# Patient Record
Sex: Male | Born: 1935 | Race: White | Hispanic: No | Marital: Married | Smoking: Current every day smoker
Health system: Southern US, Community
[De-identification: ages and names within clinical notes are randomized; demographics above are authoritative.]

## PROBLEM LIST (undated history)

## (undated) DIAGNOSIS — I249 Acute ischemic heart disease, unspecified: Secondary | ICD-10-CM

## (undated) DIAGNOSIS — I10 Essential (primary) hypertension: Secondary | ICD-10-CM

## (undated) DIAGNOSIS — E119 Type 2 diabetes mellitus without complications: Secondary | ICD-10-CM

## (undated) HISTORY — PX: APPENDECTOMY: SHX54

---

## 2006-10-22 ENCOUNTER — Other Ambulatory Visit: Payer: Self-pay

## 2006-10-22 ENCOUNTER — Inpatient Hospital Stay: Payer: Self-pay | Admitting: Internal Medicine

## 2007-03-09 ENCOUNTER — Ambulatory Visit: Payer: Self-pay | Admitting: Family Medicine

## 2008-01-10 ENCOUNTER — Ambulatory Visit: Payer: Self-pay | Admitting: Family Medicine

## 2009-08-15 HISTORY — PX: CORONARY ARTERY BYPASS GRAFT: SHX141

## 2009-11-07 ENCOUNTER — Inpatient Hospital Stay: Payer: Self-pay | Admitting: Internal Medicine

## 2009-12-30 ENCOUNTER — Emergency Department: Payer: Self-pay | Admitting: Unknown Physician Specialty

## 2011-03-29 ENCOUNTER — Ambulatory Visit: Payer: Self-pay | Admitting: Cardiovascular Disease

## 2012-11-21 ENCOUNTER — Ambulatory Visit: Payer: Self-pay | Admitting: Ophthalmology

## 2015-06-03 ENCOUNTER — Ambulatory Visit
Admission: EM | Admit: 2015-06-03 | Discharge: 2015-06-03 | Disposition: A | Discharge status: Home / Self Care | Payer: Medicare Other | Attending: Family Medicine | Admitting: Family Medicine

## 2015-06-03 ENCOUNTER — Ambulatory Visit: Payer: Medicare Other

## 2015-06-03 DIAGNOSIS — T148XXA Other injury of unspecified body region, initial encounter: Secondary | ICD-10-CM

## 2015-06-03 DIAGNOSIS — S42201A Unspecified fracture of upper end of right humerus, initial encounter for closed fracture: Secondary | ICD-10-CM | POA: Diagnosis not present

## 2015-06-03 HISTORY — DX: Type 2 diabetes mellitus without complications: E11.9

## 2015-06-03 HISTORY — DX: Essential (primary) hypertension: I10

## 2015-06-03 MED ORDER — TETANUS-DIPHTH-ACELL PERTUSSIS 5-2.5-18.5 LF-MCG/0.5 IM SUSP
0.5000 mL | Freq: Once | INTRAMUSCULAR | Status: AC
  Administered 2015-06-03: 0.5 mL via INTRAMUSCULAR

## 2015-06-03 MED ORDER — INFLUENZA VIRUS VACC SPLIT PF IM SUSP
0.5000 mL | Freq: Once | INTRAMUSCULAR | Status: AC
  Administered 2015-06-03: 0.5 mL via INTRAMUSCULAR

## 2015-06-03 MED ORDER — HYDROCODONE-ACETAMINOPHEN 5-325 MG PO TABS: 1.0000 | ORAL_TABLET | Freq: Three times a day (TID) | ORAL | Status: AC | PRN

## 2015-06-03 NOTE — ED Notes (Signed)
Pt states "I fell yesterday and injured my head and right upper arm." Denies LOC. Bruising noted to right forehead/temporal area. Difficulty raising right arm.

## 2015-06-03 NOTE — ED Provider Notes (Signed)
CSN: [id number]     Arrival date & time 06/03/15  1400 History   First MD Initiated Contact with Patient 06/03/15 1449     Chief Complaint  Patient presents with  . Fall  . Head Injury  . Shoulder Pain  . Arm Injury   (Consider location/radiation/quality/duration/timing/severity/associated sxs/prior Treatment) HPI Comments: 81 yo male with a h/o fall yesterday, hitting his right forehead area and right upper arm. Denies any loss of consciousness, vision changes, numbness/tingling. Sustained a skin abrasion on the right forehead and complains of pain to the right shoulder worse with movement. States he tripped over a gate on his porch. Denies any chest pains or shortness of breath.  The history is provided by the patient.    Past Medical History  Diagnosis Date  . Diabetes mellitus without complication (HCC)   . Hypertension    Past Surgical History  Procedure Laterality Date  . Appendectomy     History reviewed. No pertinent family history. Social History  Substance Use Topics  . Smoking status: Current Every Day Smoker -- 2.00 packs/day  . Smokeless tobacco: None  . Alcohol Use: No    Review of Systems  Allergies  Review of patient's allergies indicates no known allergies.  Home Medications   Prior to Admission medications   Medication Sig Start Date End Date Taking? Authorizing Provider  aspirin 81 MG chewable tablet Chew by mouth daily.   Yes Historical Provider, MD  enalapril (VASOTEC) 2.5 MG tablet Take 2.5 mg by mouth daily.   Yes Historical Provider, MD  metFORMIN (GLUCOPHAGE) 500 MG tablet Take by mouth 2 (two) times daily with a meal.   Yes Historical Provider, MD  metoprolol succinate (TOPROL-XL) 50 MG 24 hr tablet Take 50 mg by mouth daily. Take with or immediately following a meal.   Yes Historical Provider, MD  rosuvastatin (CRESTOR) 40 MG tablet Take 40 mg by mouth daily.   Yes Historical Provider, MD  HYDROcodone-acetaminophen (NORCO/VICODIN) 5-325 MG  tablet Take 1 tablet by mouth every 8 (eight) hours as needed for moderate pain or severe pain (do not drive or operate machinery while taking as can cause drowsiness). 06/03/15   Renford DillsLindsey Miller, NP   Meds Ordered and Administered this Visit   Medications  influenza  inactive virus vaccine (FLUZONE/FLUARIX) injection 0.5 mL (not administered)  Tdap (BOOSTRIX) injection 0.5 mL (0.5 mLs Intramuscular Given 06/03/15 1623)    BP 103/78 mmHg  Temp(Src) 98.5 F (36.9 C) (Tympanic)  Resp 16  Ht 6' (1.829 m)  Wt 136 lb (61.689 kg)  BMI 18.44 kg/m2  SpO2 94% No data found.   Physical Exam  Constitutional: He appears well-developed and well-nourished. No distress.  HENT:  Head: Normocephalic.  Right Ear: External ear normal.  Left Ear: External ear normal.  Nose: Nose normal.  approx 3cm superficial skin abrasion on the right forehead area  Eyes: EOM are normal. Pupils are equal, round, and reactive to light.  Neck: Neck supple.  Cardiovascular: Normal rate, regular rhythm and normal heart sounds.   Pulmonary/Chest: Effort normal and breath sounds normal. No respiratory distress. He has no wheezes. He has no rales.  Musculoskeletal:       Right shoulder: He exhibits decreased range of motion, tenderness, bony tenderness, swelling and decreased strength. He exhibits no effusion, no crepitus, no deformity, no laceration and normal pulse.  Right upper extremity neurovascularly intact; normal ROM of his hand/fingers, wrist and elbow; limited abduction of right shoulder secondary to pain  Lymphadenopathy:    He has no cervical adenopathy.  Skin: He is not diaphoretic.  Nursing note and vitals reviewed.   ED Course  Procedures (including critical care time)  Labs Review Labs Reviewed - No data to display  Imaging Review Dg Shoulder Right  06/03/2015  CLINICAL DATA:  Status post fall. EXAM: RIGHT SHOULDER - 2+ VIEW COMPARISON:  None. FINDINGS: There is a comminuted fracture of the  surgical neck of the right proximal humerus with minimal displacement. There is no glenohumeral dislocation. The acromioclavicular joint is congruent. IMPRESSION: Comminuted fracture of the surgical neck of the right proximal humerus with minimal displacement. Electronically Signed   By: Elige Ko   On: 06/03/2015 15:34   Dg Humerus Right  06/03/2015  CLINICAL DATA:  Status post fall.  Pain. EXAM: RIGHT HUMERUS - 2+ VIEW COMPARISON:  None. FINDINGS: Mildly comminuted fracture of the surgical neck of the right proximal humerus with minimal displacement. No glenohumeral dislocation. Congruent acromioclavicular joint. IMPRESSION: Mildly comminuted fracture of the surgical neck of the right proximal humerus. Electronically Signed   By: Elige Ko   On: 06/03/2015 15:36     Visual Acuity Review  Right Eye Distance:   Left Eye Distance:   Bilateral Distance:    Right Eye Near:   Left Eye Near:    Bilateral Near:         MDM   1. Proximal humerus fracture, right, closed, initial encounter   2. Abrasion of skin   (forehead)  1. Labs/x-ray results and diagnosis reviewed with patient/parent/guardian/family 2. rx as per orders above; reviewed possible side effects, interactions, risks and benefits  3. Right arm immobilized with sling 4. Forehead skin wound cleaned and dressed 5. Tetanus vaccine given 6. Follow up with orthopedist tomorrow (appointment set up; patient given info) 7. Follow-up prn if symptoms worsen or don't improve    Payton Mccallum, MD 06/03/15 1721

## 2015-06-03 NOTE — Discharge Instructions (Signed)
Humerus Fracture Treated With Immobilization The humerus is the large bone in your upper arm. You have a broken (fractured) humerus. These fractures are easily diagnosed with X-rays. TREATMENT  Simple fractures which will heal without disability are treated with simple immobilization. Immobilization means you will wear a cast, splint, or sling. You have a fracture which will do well with immobilization. The fracture will heal well simply by being held in a good position until it is stable enough to begin range of motion exercises. Do not take part in activities which would further injure your arm.  HOME CARE INSTRUCTIONS   Put ice on the injured area.  Put ice in a plastic bag.  Place a towel between your skin and the bag.  Leave the ice on for 15-20 minutes, 03-04 times a day.  If you have a cast:  Do not scratch the skin under the cast using sharp or pointed objects.  Check the skin around the cast every day. You may put lotion on any red or sore areas.  Keep your cast dry and clean.  If you have a splint:  Wear the splint as directed.  Keep your splint dry and clean.  You may loosen the elastic around the splint if your fingers become numb, tingle, or turn cold or blue.  If you have a sling:  Wear the sling as directed.  Do not put pressure on any part of your cast or splint until it is fully hardened.  Your cast or splint can be protected during bathing with a plastic bag. Do not lower the cast or splint into water.  Only take over-the-counter or prescription medicines for pain, discomfort, or fever as directed by your caregiver.  Do range of motion exercises as instructed by your caregiver.  Follow up as directed by your caregiver. This is very important in order to avoid permanent injury or disability and chronic pain. SEEK IMMEDIATE MEDICAL CARE IF:   Your skin or nails in the injured arm turn blue or gray.  Your arm feels cold or numb.  You develop severe pain  in the injured arm.  You are having problems with the medicines you were given. MAKE SURE YOU:   Understand these instructions.  Will watch your condition.  Will get help right away if you are not doing well or get worse.   This information is not intended to replace advice given to you by your health care provider. Make sure you discuss any questions you have with your health care provider.   Document Released: 11/07/2000 Document Revised: 08/22/2014 Document Reviewed: 12/24/2014 Elsevier Interactive Patient Education 2016 [employer]   Follow up tomorrow with Dr. Hyacinth MeekerMiller (Orthopedist); 06/04/15 at 1:20 [facility] [phone]

## 2016-07-12 ENCOUNTER — Emergency Department: Payer: Medicare Other

## 2016-07-12 ENCOUNTER — Encounter: Payer: Self-pay | Admitting: Emergency Medicine

## 2016-07-12 ENCOUNTER — Inpatient Hospital Stay
Admission: EM | Admit: 2016-07-12 | Discharge: 2016-07-15 | DRG: 470 | Disposition: A | Discharge status: Skilled Nursing Facility | Payer: Medicare Other | Attending: Internal Medicine | Admitting: Internal Medicine

## 2016-07-12 DIAGNOSIS — Z7982 Long term (current) use of aspirin: Secondary | ICD-10-CM | POA: Diagnosis not present

## 2016-07-12 DIAGNOSIS — Y92009 Unspecified place in unspecified non-institutional (private) residence as the place of occurrence of the external cause: Secondary | ICD-10-CM | POA: Diagnosis not present

## 2016-07-12 DIAGNOSIS — W19XXXA Unspecified fall, initial encounter: Secondary | ICD-10-CM

## 2016-07-12 DIAGNOSIS — I10 Essential (primary) hypertension: Secondary | ICD-10-CM | POA: Diagnosis present

## 2016-07-12 DIAGNOSIS — E785 Hyperlipidemia, unspecified: Secondary | ICD-10-CM | POA: Diagnosis present

## 2016-07-12 DIAGNOSIS — J449 Chronic obstructive pulmonary disease, unspecified: Secondary | ICD-10-CM | POA: Diagnosis present

## 2016-07-12 DIAGNOSIS — S72031A Displaced midcervical fracture of right femur, initial encounter for closed fracture: Secondary | ICD-10-CM | POA: Diagnosis present

## 2016-07-12 DIAGNOSIS — Z951 Presence of aortocoronary bypass graft: Secondary | ICD-10-CM | POA: Diagnosis not present

## 2016-07-12 DIAGNOSIS — W010XXA Fall on same level from slipping, tripping and stumbling without subsequent striking against object, initial encounter: Secondary | ICD-10-CM | POA: Diagnosis present

## 2016-07-12 DIAGNOSIS — R262 Difficulty in walking, not elsewhere classified: Secondary | ICD-10-CM

## 2016-07-12 DIAGNOSIS — R0902 Hypoxemia: Secondary | ICD-10-CM | POA: Diagnosis not present

## 2016-07-12 DIAGNOSIS — I2510 Atherosclerotic heart disease of native coronary artery without angina pectoris: Secondary | ICD-10-CM | POA: Diagnosis present

## 2016-07-12 DIAGNOSIS — Z8249 Family history of ischemic heart disease and other diseases of the circulatory system: Secondary | ICD-10-CM

## 2016-07-12 DIAGNOSIS — Z79899 Other long term (current) drug therapy: Secondary | ICD-10-CM

## 2016-07-12 DIAGNOSIS — M6281 Muscle weakness (generalized): Secondary | ICD-10-CM

## 2016-07-12 DIAGNOSIS — Z794 Long term (current) use of insulin: Secondary | ICD-10-CM | POA: Diagnosis not present

## 2016-07-12 DIAGNOSIS — S72009A Fracture of unspecified part of neck of unspecified femur, initial encounter for closed fracture: Secondary | ICD-10-CM

## 2016-07-12 DIAGNOSIS — E119 Type 2 diabetes mellitus without complications: Secondary | ICD-10-CM | POA: Diagnosis present

## 2016-07-12 DIAGNOSIS — M25551 Pain in right hip: Secondary | ICD-10-CM | POA: Diagnosis present

## 2016-07-12 DIAGNOSIS — S7291XA Unspecified fracture of right femur, initial encounter for closed fracture: Secondary | ICD-10-CM | POA: Diagnosis present

## 2016-07-12 DIAGNOSIS — Q899 Congenital malformation, unspecified: Secondary | ICD-10-CM

## 2016-07-12 DIAGNOSIS — F17210 Nicotine dependence, cigarettes, uncomplicated: Secondary | ICD-10-CM | POA: Diagnosis present

## 2016-07-12 HISTORY — DX: Acute ischemic heart disease, unspecified: I24.9

## 2016-07-12 LAB — CBC WITH DIFFERENTIAL/PLATELET
Basophils Absolute: 0.1 10*3/uL (ref 0–0.1)
Basophils Relative: 1 %
EOS ABS: 0.3 10*3/uL (ref 0–0.7)
EOS PCT: 3 %
HCT: 39.8 % — ABNORMAL LOW (ref 40.0–52.0)
HEMOGLOBIN: 13.4 g/dL (ref 13.0–18.0)
LYMPHS ABS: 1.8 10*3/uL (ref 1.0–3.6)
LYMPHS PCT: 20 %
MCH: 29.9 pg (ref 26.0–34.0)
MCHC: 33.8 g/dL (ref 32.0–36.0)
MCV: 88.5 fL (ref 80.0–100.0)
MONOS PCT: 7 %
Monocytes Absolute: 0.6 10*3/uL (ref 0.2–1.0)
NEUTROS PCT: 69 %
Neutro Abs: 6.3 10*3/uL (ref 1.4–6.5)
Platelets: 211 10*3/uL (ref 150–440)
RBC: 4.49 MIL/uL (ref 4.40–5.90)
RDW: 15.4 % — ABNORMAL HIGH (ref 11.5–14.5)
WBC: 9.1 10*3/uL (ref 3.8–10.6)

## 2016-07-12 LAB — COMPREHENSIVE METABOLIC PANEL
ALT: 17 U/L (ref 17–63)
AST: 32 U/L (ref 15–41)
Albumin: 3.9 g/dL (ref 3.5–5.0)
Alkaline Phosphatase: 72 U/L (ref 38–126)
Anion gap: 8 (ref 5–15)
BUN: 18 mg/dL (ref 6–20)
CHLORIDE: 96 mmol/L — AB (ref 101–111)
CO2: 28 mmol/L (ref 22–32)
CREATININE: 0.75 mg/dL (ref 0.61–1.24)
Calcium: 9 mg/dL (ref 8.9–10.3)
GFR calc Af Amer: >60 mL/min (ref >60)
GFR calc non Af Amer: >60 mL/min (ref >60)
Glucose, Bld: 153 mg/dL — ABNORMAL HIGH (ref 65–99)
POTASSIUM: 4.4 mmol/L (ref 3.5–5.1)
SODIUM: 132 mmol/L — AB (ref 135–145)
Total Bilirubin: 0.5 mg/dL (ref 0.3–1.2)
Total Protein: 7.4 g/dL (ref 6.5–8.1)

## 2016-07-12 LAB — URINALYSIS COMPLETE WITH MICROSCOPIC (ARMC ONLY)
BILIRUBIN URINE: NEGATIVE
Bacteria, UA: NONE SEEN
GLUCOSE, UA: 50 mg/dL — AB
HGB URINE DIPSTICK: NEGATIVE
KETONES UR: NEGATIVE mg/dL
Leukocytes, UA: NEGATIVE
NITRITE: NEGATIVE
Protein, ur: NEGATIVE mg/dL
SPECIFIC GRAVITY, URINE: 1.012 (ref 1.005–1.030)
Squamous Epithelial / LPF: NONE SEEN
pH: 6 (ref 5.0–8.0)

## 2016-07-12 LAB — PROTIME-INR
INR: 1
PROTHROMBIN TIME: 13.2 s (ref 11.4–15.2)

## 2016-07-12 LAB — GLUCOSE, CAPILLARY: Glucose-Capillary: 75 mg/dL (ref 65–99)

## 2016-07-12 LAB — ABO/RH: ABO/RH(D): A POS

## 2016-07-12 LAB — APTT: APTT: 31 s (ref 24–36)

## 2016-07-12 MED ORDER — POLYETHYLENE GLYCOL 3350 17 G PO PACK: 17.0000 g | PACK | Freq: Every day | ORAL | Status: DC | PRN

## 2016-07-12 MED ORDER — INSULIN GLARGINE 100 UNIT/ML ~~LOC~~ SOLN
32.0000 [IU] | Freq: Every day | SUBCUTANEOUS | Status: DC
  Filled 2016-07-12: qty 0.32

## 2016-07-12 MED ORDER — MORPHINE SULFATE (PF) 4 MG/ML IV SOLN
4.0000 mg | Freq: Once | INTRAVENOUS | Status: AC
  Administered 2016-07-12: 4 mg via INTRAVENOUS
  Filled 2016-07-12: qty 1

## 2016-07-12 MED ORDER — ONDANSETRON HCL 4 MG PO TABS: 4.0000 mg | ORAL_TABLET | Freq: Four times a day (QID) | ORAL | Status: DC | PRN

## 2016-07-12 MED ORDER — INSULIN ASPART 100 UNIT/ML ~~LOC~~ SOLN: 0.0000 [IU] | Freq: Every day | SUBCUTANEOUS | Status: DC

## 2016-07-12 MED ORDER — MORPHINE SULFATE (PF) 4 MG/ML IV SOLN
2.0000 mg | Freq: Once | INTRAVENOUS | Status: AC
  Administered 2016-07-12: 2 mg via INTRAVENOUS
  Filled 2016-07-12: qty 1

## 2016-07-12 MED ORDER — ONDANSETRON HCL 4 MG/2ML IJ SOLN: 4.0000 mg | Freq: Four times a day (QID) | INTRAMUSCULAR | Status: DC | PRN

## 2016-07-12 MED ORDER — METOPROLOL SUCCINATE ER 50 MG PO TB24
50.0000 mg | ORAL_TABLET | Freq: Every day | ORAL | Status: DC
  Administered 2016-07-14: 50 mg via ORAL
  Filled 2016-07-12 (×2): qty 1

## 2016-07-12 MED ORDER — MORPHINE SULFATE (PF) 4 MG/ML IV SOLN: 2.0000 mg | INTRAVENOUS | Status: DC | PRN

## 2016-07-12 MED ORDER — CEFAZOLIN SODIUM-DEXTROSE 2-4 GM/100ML-% IV SOLN
2.0000 g | INTRAVENOUS | Status: AC
  Administered 2016-07-13: 2 g via INTRAVENOUS
  Filled 2016-07-12: qty 100

## 2016-07-12 MED ORDER — METHOCARBAMOL 500 MG PO TABS
500.0000 mg | ORAL_TABLET | Freq: Four times a day (QID) | ORAL | Status: DC | PRN
  Administered 2016-07-12: 500 mg via ORAL
  Filled 2016-07-12: qty 1

## 2016-07-12 MED ORDER — ONDANSETRON HCL 4 MG/2ML IJ SOLN
4.0000 mg | INTRAMUSCULAR | Status: AC
  Administered 2016-07-12: 4 mg via INTRAVENOUS
  Filled 2016-07-12: qty 2

## 2016-07-12 MED ORDER — ACETAMINOPHEN 650 MG RE SUPP
650.0000 mg | Freq: Four times a day (QID) | RECTAL | Status: DC | PRN
Start: 2016-07-12 — End: 2016-07-13

## 2016-07-12 MED ORDER — ROSUVASTATIN CALCIUM 20 MG PO TABS
40.0000 mg | ORAL_TABLET | Freq: Every day | ORAL | Status: DC
  Administered 2016-07-14 – 2016-07-15 (×2): 40 mg via ORAL
  Filled 2016-07-12 (×2): qty 2

## 2016-07-12 MED ORDER — ASPIRIN 81 MG PO CHEW
81.0000 mg | CHEWABLE_TABLET | Freq: Every day | ORAL | Status: DC
  Administered 2016-07-14 – 2016-07-15 (×2): 81 mg via ORAL
  Filled 2016-07-12 (×2): qty 1

## 2016-07-12 MED ORDER — DEXTROSE-NACL 5-0.45 % IV SOLN
INTRAVENOUS | Status: DC
  Administered 2016-07-12: 1000 mL via INTRAVENOUS

## 2016-07-12 MED ORDER — INSULIN ASPART 100 UNIT/ML ~~LOC~~ SOLN
0.0000 [IU] | Freq: Three times a day (TID) | SUBCUTANEOUS | Status: DC
  Administered 2016-07-14 (×2): 2 [IU] via SUBCUTANEOUS
  Administered 2016-07-14: 5 [IU] via SUBCUTANEOUS
  Administered 2016-07-15: 3 [IU] via SUBCUTANEOUS
  Administered 2016-07-15: 1 [IU] via SUBCUTANEOUS
  Filled 2016-07-12: qty 5
  Filled 2016-07-12: qty 2
  Filled 2016-07-12: qty 3
  Filled 2016-07-12: qty 2
  Filled 2016-07-12: qty 1

## 2016-07-12 MED ORDER — OXYCODONE HCL 5 MG PO TABS: 5.0000 mg | ORAL_TABLET | ORAL | Status: DC | PRN

## 2016-07-12 MED ORDER — DOCUSATE SODIUM 100 MG PO CAPS
100.0000 mg | ORAL_CAPSULE | Freq: Two times a day (BID) | ORAL | Status: DC
  Administered 2016-07-12: 100 mg via ORAL
  Filled 2016-07-12: qty 1

## 2016-07-12 MED ORDER — METHOCARBAMOL 1000 MG/10ML IJ SOLN
500.0000 mg | Freq: Four times a day (QID) | INTRAVENOUS | Status: DC | PRN
  Filled 2016-07-12: qty 5

## 2016-07-12 MED ORDER — ACETAMINOPHEN 325 MG PO TABS
650.0000 mg | ORAL_TABLET | Freq: Four times a day (QID) | ORAL | Status: DC | PRN
Start: 2016-07-12 — End: 2016-07-13

## 2016-07-12 NOTE — Progress Notes (Signed)
   [facility] [place] at [facility]   Advance care planning  Hospital Day: 0 days Miguel Kennedy is a 82 y.o. male presenting with Leg Pain and Fall .   Advance care planning discussed with patient  with additional Family at bedside. All questions in regards to overall condition and expected prognosis answered. The decision was made to continue current code status  CODE STATUS: full Time spent: 18 minutes

## 2016-07-12 NOTE — ED Notes (Signed)
Patient transported to radiology

## 2016-07-12 NOTE — ED Notes (Signed)
Patient started on 3L oxygen, he was satting in mid 80's sitting still in bed.  O2 sensor changed and fingers changed with no change in previous results.

## 2016-07-12 NOTE — H&P (Signed)
[facility] - [place] at [facility]   PATIENT NAME: Miguel Kennedy    MR#:  [id number]  DATE OF BIRTH:  07-Oct-1930   DATE OF ADMISSION:  07/12/2016  PRIMARY CARE PHYSICIAN: Maryruth HancockSALLIE PATEL, MD   REQUESTING/REFERRING PHYSICIAN: York CeriseForbach  CHIEF COMPLAINT:   Chief Complaint  Patient presents with  . Leg Pain  . Fall    HISTORY OF PRESENT ILLNESS:  Miguel Kennedy  is a 82 y.o. male with a known history of Type 2 diabetes insulin requiring, coronary artery disease status post bypass who is presenting after mechanical fall. Patient suffered a mechanical fall while at home fell into the Christmas tree and noticed immediate pain of the right hip. Described only as "pain" intensity 8/10 nonradiating worse with movement no relieving factors. Current denies any symptoms.  PAST MEDICAL HISTORY:   Past Medical History:  Diagnosis Date  . ACS (acute coronary syndrome) (HCC)    s/p CABG in 2011  . Diabetes mellitus without complication (HCC)   . Hypertension     PAST SURGICAL HISTORY:   Past Surgical History:  Procedure Laterality Date  . APPENDECTOMY    . CORONARY ARTERY BYPASS GRAFT  2011    SOCIAL HISTORY:   Social History  Substance Use Topics  . Smoking status: Current Every Day Smoker    Packs/day: 2.00  . Smokeless tobacco: Never Used  . Alcohol use No    FAMILY HISTORY:   Family History  Problem Relation Age of Onset  . Hypertension Other     DRUG ALLERGIES:  No Known Allergies  REVIEW OF SYSTEMS:  REVIEW OF SYSTEMS:  CONSTITUTIONAL: Denies fevers, chills, fatigue, weakness.  EYES: Denies blurred vision, double vision, or eye pain.  EARS, NOSE, THROAT: Denies tinnitus, ear pain, hearing loss.  RESPIRATORY: denies cough, shortness of breath, wheezing  CARDIOVASCULAR: Denies chest pain, palpitations, edema.  GASTROINTESTINAL: Denies nausea, vomiting, diarrhea, abdominal pain.  GENITOURINARY: Denies dysuria, hematuria.  ENDOCRINE: Denies  nocturia or thyroid problems. HEMATOLOGIC AND LYMPHATIC: Denies easy bruising or bleeding.  SKIN: Denies rash or lesions.  MUSCULOSKELETAL: Denies pain in neck, back, shoulder, knees, , or further arthritic symptoms. Positive right-sided hip pain NEUROLOGIC: Denies paralysis, paresthesias.  PSYCHIATRIC: Denies anxiety or depressive symptoms. Otherwise full review of systems performed by me is negative.   MEDICATIONS AT HOME:   Prior to Admission medications   Medication Sig Start Date End Date Taking? Authorizing Provider  aspirin 81 MG chewable tablet Chew by mouth daily.    Historical Provider, MD  enalapril (VASOTEC) 2.5 MG tablet Take 2.5 mg by mouth daily.    Historical Provider, MD  HYDROcodone-acetaminophen (NORCO/VICODIN) 5-325 MG tablet Take 1 tablet by mouth every 8 (eight) hours as needed for moderate pain or severe pain (do not drive or operate machinery while taking as can cause drowsiness). 06/03/15   Renford DillsLindsey Miller, NP  metFORMIN (GLUCOPHAGE) 500 MG tablet Take by mouth 2 (two) times daily with a meal.    Historical Provider, MD  metoprolol succinate (TOPROL-XL) 50 MG 24 hr tablet Take 50 mg by mouth daily. Take with or immediately following a meal.    Historical Provider, MD  rosuvastatin (CRESTOR) 40 MG tablet Take 40 mg by mouth daily.    Historical Provider, MD      VITAL SIGNS:  Blood pressure (!) 178/85, pulse (!) 108, temperature 98.2 F (36.8 C), temperature source Oral, height 6' (1.829 m), weight 63.5 kg (140 lb), SpO2 96 %.  PHYSICAL EXAMINATION:  VITAL SIGNS: Vitals:   07/12/16 1830  BP: (!) 178/85  Pulse: (!) 108  Temp: 98.2 F (36.8 C)   GENERAL:82 y.o.male currently in no acute distress.  HEAD: Normocephalic, atraumatic.  EYES: Pupils equal, round, reactive to light. Extraocular muscles intact. No scleral icterus.  MOUTH: Moist mucosal membrane. Dentition Poor. No abscess noted.  EAR, NOSE, THROAT: Clear without exudates. No external lesions.    NECK: Supple. No thyromegaly. No nodules. No JVD.  PULMONARY: Clear to ascultation, without wheeze rails or rhonci. No use of accessory muscles, Good respiratory effort. good air entry bilaterally CHEST: Nontender to palpation.  CARDIOVASCULAR: S1 and S2. Regular rate and rhythm. No murmurs, rubs, or gallops. No edema. Pedal pulses 2+ bilaterally.  GASTROINTESTINAL: Soft, nontender, nondistended. No masses. Positive bowel sounds. No hepatosplenomegaly.  MUSCULOSKELETAL: No swelling, clubbing, or edema. Limited Range of motion right lower extremity NEUROLOGIC: Cranial nerves II through XII are intact. No gross focal neurological deficits. Sensation intact. Reflexes intact.  SKIN: No ulceration, lesions, rashes, or cyanosis. Skin warm and dry. Turgor intact.  PSYCHIATRIC: Mood, affect within normal limits. The patient is awake, alert and oriented x 3. Insight, judgment intact.    LABORATORY PANEL:   CBC  Recent Labs Lab 07/12/16 1833  WBC 9.1  HGB 13.4  HCT 39.8*  PLT 211   ------------------------------------------------------------------------------------------------------------------  Chemistries   Recent Labs Lab 07/12/16 1833  NA 132*  K 4.4  CL 96*  CO2 28  GLUCOSE 153*  BUN 18  CREATININE 0.75  CALCIUM 9.0  AST 32  ALT 17  ALKPHOS 72  BILITOT 0.5   ------------------------------------------------------------------------------------------------------------------  Cardiac Enzymes No results for input(s): TROPONINI in the last 168 hours. ------------------------------------------------------------------------------------------------------------------  RADIOLOGY:  Dg Chest Port 1 View  Result Date: 07/12/2016 CLINICAL DATA:  Preop right hip fracture. Hypertension and tobacco use. EXAM: PORTABLE CHEST 1 VIEW COMPARISON:  12/30/2009 FINDINGS: The lungs are hyperinflated without pneumonic consolidation. Bilateral chronic appearing rib fractures are noted involving  the left fourth and fifth ribs and right fifth, sixth and seventh ribs. Patient is status post CABG. There is aortic atherosclerosis. No effusion or pneumothorax. Pulmonary vasculature is unremarkable. IMPRESSION: No acute pneumonic consolidation. Hyperinflated lungs consistent with COPD. Electronically Signed   By: Tollie Ethavid  Kwon M.D.   On: 07/12/2016 19:19   Dg Hip Unilat With Pelvis 2-3 Views Right  Result Date: 07/12/2016 CLINICAL DATA:  Patient fell complaining of right lower extremity pain. EXAM: DG HIP (WITH OR WITHOUT PELVIS) 2-3V RIGHT COMPARISON:  10/25/2006 KUB FINDINGS: There is an acute, closed, varus angulated transcervical fracture of the right femur sparing the trochanters. The bony pelvis and contralateral hip are intact. There is aortoiliac and branch vessel atherosclerosis. IMPRESSION: Acute, closed, varus angulated transcervical fracture of the proximal right femur. Electronically Signed   By: Tollie Ethavid  Kwon M.D.   On: 07/12/2016 19:15    EKG:   Orders placed or performed during the hospital encounter of 07/12/16  . ED EKG  . ED EKG    IMPRESSION AND PLAN:   82 year old Caucasian gentleman history of type 2 diabetes insulin requiring presenting after mechanical fall  1. Preoperative evaluation for right-sided hip fracture: Orthopedic consult, patient should be considered moderate risk for moderate risk surgery from cardiac standpoint no further testing or interventions required prior to surgery. As far as medications will hold lisinopril in the preoperative period can be restarted postoperatively to avoid intraoperative hypotension  2. Type 2 diabetes insulin requiring: Hold oral agents add sliding scale  coverage, patient already has taking Lantus 32 units before coming to the hospital would have ideally decrease this by half as he'll be nothing by mouth midnight but instead will add D5 half normal saline at rate, can restart Lantus at normal dosing tomorrow  3. Essential  hypertension continue beta blocker hold lisinopril as above 4. Tobacco abuse: Patient smokes about a pack a day no desire to quit at this time we discussed various options, health risks and offered nicotine replacement in the hospital he states he has no arcus at this time. Time spent 4 minutes    All the records are reviewed and case discussed with ED provider. Management plans discussed with the patient, family and they are in agreement.  CODE STATUS: Full  TOTAL TIME TAKING CARE OF THIS PATIENT: 45 minutes.    Hower,  Mardi Mainland.D on 07/12/2016 at 8:01 PM  Between 7am to 6pm - Pager - [phone]  After 6pm: House Pager: - [phone]  [facility] [place] [facility]  Office  [phone]  CC: Primary care physician; Maryruth Hancock, MD

## 2016-07-12 NOTE — Consult Note (Signed)
ORTHOPAEDIC CONSULTATION  REQUESTING PHYSICIAN: Miguel Hasteavid K Hower, MD  Chief Complaint: Right hip pain status post fall  HPI: Miguel Kennedy is a 82 y.o. male who complains of pain in the right hip and an inability bear weight after a fall at home earlier today.  X-rays of the right hip were taken which are macerated displaced right femoral neck hip fracture.  Patient had another recent fall and sustained a fracture to the right proximal humerus involving the greater tuberosity.  Past Medical History:  Diagnosis Date  . ACS (acute coronary syndrome) (HCC)    s/p CABG in 2011  . Diabetes mellitus without complication (HCC)   . Hypertension    Past Surgical History:  Procedure Laterality Date  . APPENDECTOMY    . CORONARY ARTERY BYPASS GRAFT  2011   Social History   Social History  . Marital status: Married    Spouse name: N/A  . Number of children: N/A  . Years of education: N/A   Social History Main Topics  . Smoking status: Current Every Day Smoker    Packs/day: 2.00  . Smokeless tobacco: Never Used  . Alcohol use No  . Drug use: No  . Sexual activity: Not Asked   Other Topics Concern  . None   Social History Narrative  . None   Family History  Problem Relation Age of Onset  . Hypertension Other    No Known Allergies Prior to Admission medications   Medication Sig Start Date End Date Taking? Authorizing Provider  aspirin 81 MG chewable tablet Chew by mouth daily.   Yes Historical Provider, MD  enalapril (VASOTEC) 2.5 MG tablet Take 2.5 mg by mouth daily.   Yes Historical Provider, MD  gabapentin (NEURONTIN) 100 MG capsule Take 1 capsule by mouth 2 (two) times daily. 06/14/16  Yes Historical Provider, MD  insulin glargine (LANTUS) 100 UNIT/ML injection Inject 32 Units into the skin at bedtime.   Yes Historical Provider, MD  metFORMIN (GLUCOPHAGE) 500 MG tablet Take by mouth 2 (two) times daily with a meal.   Yes Historical Provider, MD  rosuvastatin (CRESTOR)  40 MG tablet Take 40 mg by mouth daily.   Yes Historical Provider, MD  HYDROcodone-acetaminophen (NORCO/VICODIN) 5-325 MG tablet Take 1 tablet by mouth every 8 (eight) hours as needed for moderate pain or severe pain (do not drive or operate machinery while taking as can cause drowsiness). 06/03/15   Miguel DillsLindsey Miller, NP  metoprolol succinate (TOPROL-XL) 50 MG 24 hr tablet Take 50 mg by mouth daily. Take with or immediately following a meal.    Historical Provider, MD   Dg Chest Port 1 View  Result Date: 07/12/2016 CLINICAL DATA:  Preop right hip fracture. Hypertension and tobacco use. EXAM: PORTABLE CHEST 1 VIEW COMPARISON:  12/30/2009 FINDINGS: The lungs are hyperinflated without pneumonic consolidation. Bilateral chronic appearing rib fractures are noted involving the left fourth and fifth ribs and right fifth, sixth and seventh ribs. Patient is status post CABG. There is aortic atherosclerosis. No effusion or pneumothorax. Pulmonary vasculature is unremarkable. IMPRESSION: No acute pneumonic consolidation. Hyperinflated lungs consistent with COPD. Electronically Signed   By: Miguel Kennedy M.D.   On: 07/12/2016 19:19   Dg Hip Unilat With Pelvis 2-3 Views Right  Result Date: 07/12/2016 CLINICAL DATA:  Patient fell complaining of right lower extremity pain. EXAM: DG HIP (WITH OR WITHOUT PELVIS) 2-3V RIGHT COMPARISON:  10/25/2006 KUB FINDINGS: There is an acute, closed, varus angulated transcervical fracture of the right femur  sparing the trochanters. The bony pelvis and contralateral hip are intact. There is aortoiliac and branch vessel atherosclerosis. IMPRESSION: Acute, closed, varus angulated transcervical fracture of the proximal right femur. Electronically Signed   By: Miguel Kennedy M.D.   On: 07/12/2016 19:15    Positive ROS: All other systems have been reviewed and were otherwise negative with the exception of those mentioned in the HPI and as above.  Physical Exam: General: Alert, no acute  distress  MUSCULOSKELETAL: Right lower extremity: Patient has shortening and external rotation of the right lower extremity. He has intact skin overlying the right hip without erythema or ecchymosis. He has no significant swelling in the right thigh compartments are soft and compressible. He has palpable pedal pulses, intact sensation light touch and intact motor function distally.  Assessment: Displaced right femoral neck hip fracture.  Plan: Patient is seen in the ER. His family is at the bedside. I explained to the patient and his family the nature of the fracture. I am recommending a right hip hemiarthroplasty for definitive treatment. I discussed this operation in detail with the family is along with the postoperative course. We discussed the risks and benefits of surgery as well. They understand the risks include but are not limited to infection requiring the removal of the prosthesis, bleeding requiring blood transfusion, nerve or blood vessel injury especially injury to the sciatic nerve leading to foot drop which may require permanent use of an AFO, leg length discrepancy, change in lower extremity rotation, persistent right hip pain dislocation, fracture and the need for further surgery, including conversion to a total hip arthroplasty. Medical risks include but are not limited to DVT and pulmonary embolism,: Collection, stroke, pneumonia, respiratory failure and death. Patient family understood these risks and wished to proceed. Patient is being admitted by Dr. Clint Kennedy from the hospitalist service. Dr. Clint Kennedy has cleared the patient for surgery but has stated the patient is a moderate risk for surgery. I reviewed the laboratory studies and radiographs in preparation for this case. I answered all questions by the patient and his family. Hopeful surgery will occur tomorrow but given the heavy OR schedule it is possible surgery may be delayed until Thursday. The family understood the  situation.    Miguel FairlyKRASINSKI, Falynn Ailey, MD    07/12/2016 8:17 PM

## 2016-07-12 NOTE — ED Provider Notes (Signed)
[facility] Emergency Department Provider Note  ____________________________________________   First MD Initiated Contact with Patient 07/12/16 1830     (approximate)  I have reviewed the triage vital signs and the nursing notes.   HISTORY  Chief Complaint Leg Pain and Fall    HPI Miguel Kennedy is a 82 y.o. male with a history of coronary artery disease status post CABG in 2011 who presents for evaluation of acute onset right hip pain after mechanical fall.  He states that he was at home and got up and try to turn around too quickly, lost his balance, and fell down on the hardwood floor.  Ear pain with any attempt at movement of the right leg but as long as he is at rest he is not in pain.  His leg is significantly externally rotated and shortened.  He is able to wiggle his toes without difficulty and he denies any numbness, tingling, or weakness in his extremity, he just does not want to move it due to the pain.  He did not strike his head and he denies headache and neck pain.  He did not lose consciousness.  He denies chest pain, shortness of breath, abdominal pain, nausea, vomiting.  He has not been ill recently.   Past Medical History:  Diagnosis Date  . ACS (acute coronary syndrome) (HCC)    s/p CABG in 2011  . Diabetes mellitus without complication (HCC)   . Hypertension     Patient Active Problem List   Diagnosis Date Noted  . Femur fracture, right (HCC) 07/12/2016    Past Surgical History:  Procedure Laterality Date  . APPENDECTOMY    . CORONARY ARTERY BYPASS GRAFT  2011    Prior to Admission medications   Medication Sig Start Date End Date Taking? Authorizing Provider  aspirin 81 MG chewable tablet Chew by mouth daily.   Yes Historical Provider, MD  enalapril (VASOTEC) 2.5 MG tablet Take 2.5 mg by mouth daily.   Yes Historical Provider, MD  gabapentin (NEURONTIN) 100 MG capsule Take 1 capsule by mouth 2 (two) times daily. 06/14/16   Yes Historical Provider, MD  HYDROcodone-acetaminophen (NORCO/VICODIN) 5-325 MG tablet Take 1 tablet by mouth every 8 (eight) hours as needed for moderate pain or severe pain (do not drive or operate machinery while taking as can cause drowsiness). 06/03/15  Yes Renford DillsLindsey Miller, NP  insulin glargine (LANTUS) 100 UNIT/ML injection Inject 32 Units into the skin at bedtime.   Yes Historical Provider, MD  metFORMIN (GLUCOPHAGE) 500 MG tablet Take by mouth 2 (two) times daily with a meal.   Yes Historical Provider, MD  metoprolol succinate (TOPROL-XL) 50 MG 24 hr tablet Take 50 mg by mouth daily. Take with or immediately following a meal.   Yes Historical Provider, MD  rosuvastatin (CRESTOR) 40 MG tablet Take 40 mg by mouth daily.   Yes Historical Provider, MD    Allergies Patient has no known allergies.  Family History  Problem Relation Age of Onset  . Hypertension Other     Social History Social History  Substance Use Topics  . Smoking status: Current Every Day Smoker    Packs/day: 2.00  . Smokeless tobacco: Never Used  . Alcohol use No    Review of Systems Constitutional: No fever/chills Eyes: No visual changes. ENT: No sore throat. Cardiovascular: Denies chest pain. Respiratory: Denies shortness of breath. Gastrointestinal: No abdominal pain.  No nausea, no vomiting.  No diarrhea.  No constipation. Genitourinary: Negative  for dysuria. Musculoskeletal: Negative for back and neck pain.  +right hip pain Skin: Negative for rash. Neurological: Negative for headaches, focal weakness or numbness.  10-point ROS otherwise negative.  ____________________________________________   PHYSICAL EXAM:  VITAL SIGNS: ED Triage Vitals [07/12/16 1830]  Enc Vitals Group     BP (!) 178/85     Pulse Rate (!) 108     Resp      Temp 98.2 F (36.8 C)     Temp Source Oral     SpO2 96 %     Weight 140 lb (63.5 kg)     Height 6' (1.829 m)     Head Circumference      Peak Flow      Pain  Score      Pain Loc      Pain Edu?      Excl. in GC?     Constitutional: Alert and oriented. Well appearing and in no acute distress. Eyes: Conjunctivae are normal. PERRL. EOMI. Head: Atraumatic. Nose: No congestion/rhinnorhea. Mouth/Throat: Mucous membranes are moist.  Oropharynx non-erythematous. Neck: No stridor.  No meningeal signs.  No cervical spine tenderness to palpation. Cardiovascular: Normal rate, regular rhythm. Good peripheral circulation. Grossly normal heart sounds. Respiratory: Normal respiratory effort.  No retractions.  Mild external wheezing but no respiratory distress. Gastrointestinal: Soft and nontender. No distention.  Musculoskeletal: External rotation and shortening of the right leg.  Tender palpation in the proximal femur and right hip.  No other obvious deformities.  Neurovascularly intact distally. Neurologic:  Normal speech and language. No gross focal neurologic deficits are appreciated other than the patient being hard of hearing Skin:  Skin is warm, dry and intact. No rash noted. Psychiatric: Mood and affect are normal. Speech and behavior are normal.  ____________________________________________   LABS (all labs ordered are listed, but only abnormal results are displayed)  Labs Reviewed  COMPREHENSIVE METABOLIC PANEL - Abnormal; Notable for the following:       Result Value   Sodium 132 (*)    Chloride 96 (*)    Glucose, Bld 153 (*)    All other components within normal limits  CBC WITH DIFFERENTIAL/PLATELET - Abnormal; Notable for the following:    HCT 39.8 (*)    RDW 15.4 (*)    All other components within normal limits  URINE CULTURE  APTT  PROTIME-INR  URINALYSIS COMPLETEWITH MICROSCOPIC ([facility] ONLY)  BASIC METABOLIC PANEL  CBC  TYPE AND SCREEN   ____________________________________________  EKG  ED ECG REPORT I, Tylynn Braniff, the attending physician, personally viewed and interpreted this ECG.  Date: 07/12/2016 EKG Time:  18:41 Rate: 108 Rhythm: Sinus tachycardia QRS Axis: normal Intervals: normal ST/T Wave abnormalities: normal Conduction Disturbances: none Narrative Interpretation: Interpretation is difficult due to the significant artifact that is a result of the  Patient's acute pain, but although there are some nonspecific changes I do not believe he has any changes to indicate acute ischemia. ____________________________________________  RADIOLOGY   Dg Chest Port 1 View  Result Date: 07/12/2016 CLINICAL DATA:  Preop right hip fracture. Hypertension and tobacco use. EXAM: PORTABLE CHEST 1 VIEW COMPARISON:  12/30/2009 FINDINGS: The lungs are hyperinflated without pneumonic consolidation. Bilateral chronic appearing rib fractures are noted involving the left fourth and fifth ribs and right fifth, sixth and seventh ribs. Patient is status post CABG. There is aortic atherosclerosis. No effusion or pneumothorax. Pulmonary vasculature is unremarkable. IMPRESSION: No acute pneumonic consolidation. Hyperinflated lungs consistent with COPD. Electronically Signed  By: Tollie Eth M.D.   On: 07/12/2016 19:19   Dg Hip Unilat With Pelvis 2-3 Views Right  Result Date: 07/12/2016 CLINICAL DATA:  Patient fell complaining of right lower extremity pain. EXAM: DG HIP (WITH OR WITHOUT PELVIS) 2-3V RIGHT COMPARISON:  10/25/2006 KUB FINDINGS: There is an acute, closed, varus angulated transcervical fracture of the right femur sparing the trochanters. The bony pelvis and contralateral hip are intact. There is aortoiliac and branch vessel atherosclerosis. IMPRESSION: Acute, closed, varus angulated transcervical fracture of the proximal right femur. Electronically Signed   By: Tollie Eth M.D.   On: 07/12/2016 19:15    ____________________________________________   PROCEDURES  Procedure(s) performed:   Procedures   Critical Care performed: No ____________________________________________   INITIAL IMPRESSION /  ASSESSMENT AND PLAN / ED COURSE  Pertinent labs & imaging results that were available during my care of the patient were reviewed by me and considered in my medical decision making (see chart for details).  Strong suspicion for right hip fracture due to mechanical fall.  I will obtain the standard preop evaluation labs and imaging.  No evidence of any other acute or emergent medical condition at this time.   Clinical Course     ____________________________________________  FINAL CLINICAL IMPRESSION(S) / ED DIAGNOSES  Final diagnoses:  Closed transcervical fracture of right femur, initial encounter (HCC)     MEDICATIONS GIVEN DURING THIS VISIT:  Medications  morphine 4 MG/ML injection 4 mg (not administered)  acetaminophen (TYLENOL) tablet 650 mg (not administered)    Or  acetaminophen (TYLENOL) suppository 650 mg (not administered)  oxyCODONE (Oxy IR/ROXICODONE) immediate release tablet 5 mg (not administered)  ondansetron (ZOFRAN) tablet 4 mg (not administered)    Or  ondansetron (ZOFRAN) injection 4 mg (not administered)  docusate sodium (COLACE) capsule 100 mg (not administered)  polyethylene glycol (MIRALAX / GLYCOLAX) packet 17 g (not administered)  morphine 4 MG/ML injection 2 mg (not administered)  methocarbamol (ROBAXIN) tablet 500 mg (not administered)    Or  methocarbamol (ROBAXIN) 500 mg in dextrose 5 % 50 mL IVPB (not administered)  insulin glargine (LANTUS) injection 32 Units (not administered)  dextrose 5 %-0.45 % sodium chloride infusion (not administered)  morphine 4 MG/ML injection 2 mg (2 mg Intravenous Given 07/12/16 1922)  ondansetron (ZOFRAN) injection 4 mg (4 mg Intravenous Given 07/12/16 1923)     NEW OUTPATIENT MEDICATIONS STARTED DURING THIS VISIT:  New Prescriptions   No medications on file    Modified Medications   No medications on file    Discontinued Medications   No medications on file     Note:  This document was prepared  using Dragon voice recognition software and may include unintentional dictation errors.    Loleta Rose, MD 07/12/16 2111

## 2016-07-12 NOTE — ED Triage Notes (Signed)
Per [facility], patient comes from home. Patient states his leg gave way and he fell into the christmas tree. Patient denies LOC or hitting head. Patient denies taking blood thinners. Patient is neurologically at baseline. Patient c/o right leg pain. External fixation and shortening noted to right leg. Positive pulses and sensations bilaterally.

## 2016-07-13 ENCOUNTER — Encounter
Admission: EM | Disposition: A | Discharge status: Skilled Nursing Facility | Payer: Self-pay | Source: Home / Self Care | Attending: Internal Medicine

## 2016-07-13 ENCOUNTER — Inpatient Hospital Stay: Payer: Medicare Other

## 2016-07-13 ENCOUNTER — Inpatient Hospital Stay: Payer: Medicare Other | Admitting: Anesthesiology

## 2016-07-13 HISTORY — PX: HIP ARTHROPLASTY: SHX981

## 2016-07-13 LAB — CBC
HEMATOCRIT: 38.1 % — AB (ref 40.0–52.0)
Hemoglobin: 12.9 g/dL — ABNORMAL LOW (ref 13.0–18.0)
MCH: 29.3 pg (ref 26.0–34.0)
MCHC: 33.9 g/dL (ref 32.0–36.0)
MCV: 86.3 fL (ref 80.0–100.0)
PLATELETS: 183 10*3/uL (ref 150–440)
RBC: 4.41 MIL/uL (ref 4.40–5.90)
RDW: 14.9 % — AB (ref 11.5–14.5)
WBC: 9.6 10*3/uL (ref 3.8–10.6)

## 2016-07-13 LAB — BASIC METABOLIC PANEL
Anion gap: 6 (ref 5–15)
BUN: 14 mg/dL (ref 6–20)
CHLORIDE: 100 mmol/L — AB (ref 101–111)
CO2: 28 mmol/L (ref 22–32)
CREATININE: 0.72 mg/dL (ref 0.61–1.24)
Calcium: 8.7 mg/dL — ABNORMAL LOW (ref 8.9–10.3)
GFR calc Af Amer: >60 mL/min (ref >60)
GFR calc non Af Amer: >60 mL/min (ref >60)
GLUCOSE: 66 mg/dL (ref 65–99)
Potassium: 3.9 mmol/L (ref 3.5–5.1)
Sodium: 134 mmol/L — ABNORMAL LOW (ref 135–145)

## 2016-07-13 LAB — SURGICAL PCR SCREEN
MRSA, PCR: NEGATIVE
Staphylococcus aureus: NEGATIVE

## 2016-07-13 LAB — GLUCOSE, CAPILLARY
Glucose-Capillary: 85 mg/dL (ref 65–99)
Glucose-Capillary: 123 mg/dL — ABNORMAL HIGH (ref 65–99)
Glucose-Capillary: 96 mg/dL (ref 65–99)
Glucose-Capillary: 355 mg/dL — ABNORMAL HIGH (ref 65–99)

## 2016-07-13 LAB — POCT CBG (FASTING - GLUCOSE)-MANUAL ENTRY: GLUCOSE FASTING, POC: 123 mg/dL — AB (ref 70–99)

## 2016-07-13 SURGERY — HEMIARTHROPLASTY, HIP, DIRECT ANTERIOR APPROACH, FOR FRACTURE
Anesthesia: General | Site: Hip | Laterality: Right | Wound class: Clean

## 2016-07-13 MED ORDER — MENTHOL 3 MG MT LOZG
1.0000 | LOZENGE | OROMUCOSAL | Status: DC | PRN
  Filled 2016-07-13: qty 9

## 2016-07-13 MED ORDER — EPHEDRINE SULFATE 50 MG/ML IJ SOLN
INTRAMUSCULAR | Status: DC | PRN
  Administered 2016-07-13 (×4): 5 mg via INTRAVENOUS
  Administered 2016-07-13 (×2): 10 mg via INTRAVENOUS
  Administered 2016-07-13: 5 mg via INTRAVENOUS

## 2016-07-13 MED ORDER — MORPHINE SULFATE (PF) 2 MG/ML IV SOLN: 2.0000 mg | INTRAVENOUS | Status: DC | PRN

## 2016-07-13 MED ORDER — SODIUM CHLORIDE 0.9 % IV SOLN
75.0000 mL/h | INTRAVENOUS | Status: DC
  Administered 2016-07-13 – 2016-07-14 (×2): 75 mL/h via INTRAVENOUS

## 2016-07-13 MED ORDER — HYDROCODONE-ACETAMINOPHEN 5-325 MG PO TABS
1.0000 | ORAL_TABLET | Freq: Four times a day (QID) | ORAL | Status: DC | PRN
  Administered 2016-07-14 (×2): 1 via ORAL
  Filled 2016-07-13 (×2): qty 1

## 2016-07-13 MED ORDER — PHENOL 1.4 % MT LIQD
1.0000 | OROMUCOSAL | Status: DC | PRN
  Filled 2016-07-13: qty 177

## 2016-07-13 MED ORDER — FUROSEMIDE 10 MG/ML IJ SOLN
INTRAMUSCULAR | Status: DC | PRN
Start: 2016-07-13 — End: 2016-07-13
  Administered 2016-07-13: 20 mg via INTRAMUSCULAR

## 2016-07-13 MED ORDER — MIDAZOLAM HCL 2 MG/2ML IJ SOLN
INTRAMUSCULAR | Status: DC | PRN
  Administered 2016-07-13: 2 mg via INTRAVENOUS

## 2016-07-13 MED ORDER — METOPROLOL TARTRATE 5 MG/5ML IV SOLN
INTRAVENOUS | Status: DC | PRN
  Administered 2016-07-13: 2 mg via INTRAVENOUS
  Administered 2016-07-13 (×2): 1 mg via INTRAVENOUS

## 2016-07-13 MED ORDER — METHOCARBAMOL 1000 MG/10ML IJ SOLN
500.0000 mg | Freq: Four times a day (QID) | INTRAVENOUS | Status: DC | PRN
  Filled 2016-07-13: qty 5

## 2016-07-13 MED ORDER — ACETAMINOPHEN 325 MG PO TABS: 650.0000 mg | ORAL_TABLET | Freq: Four times a day (QID) | ORAL | Status: DC | PRN

## 2016-07-13 MED ORDER — FUROSEMIDE 10 MG/ML IJ SOLN
INTRAMUSCULAR | Status: AC
  Filled 2016-07-13: qty 2

## 2016-07-13 MED ORDER — NEOMYCIN-POLYMYXIN B GU 40-200000 IR SOLN
Status: DC | PRN
  Administered 2016-07-13: 4 mL

## 2016-07-13 MED ORDER — ONDANSETRON HCL 4 MG/2ML IJ SOLN: 4.0000 mg | Freq: Four times a day (QID) | INTRAMUSCULAR | Status: DC | PRN

## 2016-07-13 MED ORDER — DOCUSATE SODIUM 100 MG PO CAPS
100.0000 mg | ORAL_CAPSULE | Freq: Two times a day (BID) | ORAL | Status: DC
  Administered 2016-07-13 – 2016-07-15 (×4): 100 mg via ORAL
  Filled 2016-07-13 (×4): qty 1

## 2016-07-13 MED ORDER — SODIUM CHLORIDE 0.9 % IV SOLN
INTRAVENOUS | Status: DC
  Administered 2016-07-13 (×2): via INTRAVENOUS

## 2016-07-13 MED ORDER — METHOCARBAMOL 500 MG PO TABS: 500.0000 mg | ORAL_TABLET | Freq: Four times a day (QID) | ORAL | Status: DC | PRN

## 2016-07-13 MED ORDER — INSULIN GLARGINE 100 UNIT/ML ~~LOC~~ SOLN
26.0000 [IU] | Freq: Every day | SUBCUTANEOUS | Status: DC
  Administered 2016-07-13 – 2016-07-14 (×2): 26 [IU] via SUBCUTANEOUS
  Filled 2016-07-13 (×3): qty 0.26

## 2016-07-13 MED ORDER — BISACODYL 10 MG RE SUPP: 10.0000 mg | Freq: Every day | RECTAL | Status: DC | PRN

## 2016-07-13 MED ORDER — CEFAZOLIN SODIUM-DEXTROSE 2-4 GM/100ML-% IV SOLN
INTRAVENOUS | Status: AC
  Filled 2016-07-13: qty 100

## 2016-07-13 MED ORDER — FENTANYL CITRATE (PF) 100 MCG/2ML IJ SOLN
INTRAMUSCULAR | Status: DC | PRN
  Administered 2016-07-13: 25 ug via INTRAVENOUS
  Administered 2016-07-13: 50 ug via INTRAVENOUS
  Administered 2016-07-13: 25 ug via INTRAVENOUS

## 2016-07-13 MED ORDER — ACETAMINOPHEN 650 MG RE SUPP: 650.0000 mg | Freq: Four times a day (QID) | RECTAL | Status: DC | PRN

## 2016-07-13 MED ORDER — ALUM & MAG HYDROXIDE-SIMETH 200-200-20 MG/5ML PO SUSP: 30.0000 mL | ORAL | Status: DC | PRN

## 2016-07-13 MED ORDER — CEFAZOLIN SODIUM-DEXTROSE 2-4 GM/100ML-% IV SOLN
2.0000 g | Freq: Four times a day (QID) | INTRAVENOUS | Status: AC
  Administered 2016-07-13 – 2016-07-14 (×2): 2 g via INTRAVENOUS
  Filled 2016-07-13 (×2): qty 100

## 2016-07-13 MED ORDER — MAGNESIUM CITRATE PO SOLN
1.0000 | Freq: Once | ORAL | Status: DC | PRN
  Filled 2016-07-13: qty 296

## 2016-07-13 MED ORDER — PROPOFOL 10 MG/ML IV BOLUS
INTRAVENOUS | Status: DC | PRN
  Administered 2016-07-13: 20 mg via INTRAVENOUS
  Administered 2016-07-13: 80 mg via INTRAVENOUS
  Administered 2016-07-13: 20 mg via INTRAVENOUS

## 2016-07-13 MED ORDER — NEOMYCIN-POLYMYXIN B GU 40-200000 IR SOLN
Status: AC
  Filled 2016-07-13: qty 20

## 2016-07-13 MED ORDER — ROCURONIUM BROMIDE 100 MG/10ML IV SOLN
INTRAVENOUS | Status: DC | PRN
  Administered 2016-07-13: 30 mg via INTRAVENOUS
  Administered 2016-07-13: 10 mg via INTRAVENOUS

## 2016-07-13 MED ORDER — SENNA 8.6 MG PO TABS
1.0000 | ORAL_TABLET | Freq: Two times a day (BID) | ORAL | Status: DC
  Administered 2016-07-13 – 2016-07-15 (×4): 8.6 mg via ORAL
  Filled 2016-07-13 (×4): qty 1

## 2016-07-13 MED ORDER — PHENYLEPHRINE HCL 10 MG/ML IJ SOLN
INTRAMUSCULAR | Status: DC | PRN
  Administered 2016-07-13: 100 ug via INTRAVENOUS
  Administered 2016-07-13 (×2): 200 ug via INTRAVENOUS
  Administered 2016-07-13 (×3): 100 ug via INTRAVENOUS
  Administered 2016-07-13: 200 ug via INTRAVENOUS
  Administered 2016-07-13: 100 ug via INTRAVENOUS
  Administered 2016-07-13: 200 ug via INTRAVENOUS
  Administered 2016-07-13: 100 ug via INTRAVENOUS
  Administered 2016-07-13 (×2): 200 ug via INTRAVENOUS

## 2016-07-13 MED ORDER — SUGAMMADEX SODIUM 200 MG/2ML IV SOLN
INTRAVENOUS | Status: DC | PRN
  Administered 2016-07-13: 200 mg via INTRAVENOUS

## 2016-07-13 MED ORDER — GABAPENTIN 100 MG PO CAPS: 100.0000 mg | ORAL_CAPSULE | Freq: Two times a day (BID) | ORAL | Status: DC

## 2016-07-13 MED ORDER — DEXAMETHASONE SODIUM PHOSPHATE 10 MG/ML IJ SOLN
INTRAMUSCULAR | Status: DC | PRN
  Administered 2016-07-13: 4 mg via INTRAVENOUS

## 2016-07-13 MED ORDER — GABAPENTIN 100 MG PO CAPS
100.0000 mg | ORAL_CAPSULE | Freq: Two times a day (BID) | ORAL | Status: DC
  Administered 2016-07-13 – 2016-07-15 (×4): 100 mg via ORAL
  Filled 2016-07-13 (×4): qty 1

## 2016-07-13 MED ORDER — INSULIN GLARGINE 100 UNIT/ML ~~LOC~~ SOLN: 32.0000 [IU] | Freq: Every day | SUBCUTANEOUS | Status: DC

## 2016-07-13 MED ORDER — LIDOCAINE HCL (CARDIAC) 20 MG/ML IV SOLN
INTRAVENOUS | Status: DC | PRN
  Administered 2016-07-13: 80 mg via INTRAVENOUS

## 2016-07-13 MED ORDER — ENOXAPARIN SODIUM 40 MG/0.4ML ~~LOC~~ SOLN
40.0000 mg | SUBCUTANEOUS | Status: DC
  Administered 2016-07-14: 40 mg via SUBCUTANEOUS
  Filled 2016-07-13: qty 0.4

## 2016-07-13 MED ORDER — ONDANSETRON HCL 4 MG/2ML IJ SOLN: 4.0000 mg | Freq: Once | INTRAMUSCULAR | Status: DC | PRN

## 2016-07-13 MED ORDER — OXYCODONE HCL 5 MG PO TABS
5.0000 mg | ORAL_TABLET | ORAL | Status: DC | PRN
  Administered 2016-07-14 – 2016-07-15 (×2): 10 mg via ORAL
  Filled 2016-07-13: qty 1
  Filled 2016-07-13 (×2): qty 2

## 2016-07-13 MED ORDER — POLYETHYLENE GLYCOL 3350 17 G PO PACK
17.0000 g | PACK | Freq: Every day | ORAL | Status: DC | PRN
  Filled 2016-07-13: qty 1

## 2016-07-13 MED ORDER — ONDANSETRON HCL 4 MG/2ML IJ SOLN
INTRAMUSCULAR | Status: DC | PRN
  Administered 2016-07-13: 4 mg via INTRAVENOUS

## 2016-07-13 MED ORDER — ONDANSETRON HCL 4 MG PO TABS: 4.0000 mg | ORAL_TABLET | Freq: Four times a day (QID) | ORAL | Status: DC | PRN

## 2016-07-13 MED ORDER — FENTANYL CITRATE (PF) 100 MCG/2ML IJ SOLN: 25.0000 ug | INTRAMUSCULAR | Status: DC | PRN

## 2016-07-13 MED ORDER — FERROUS SULFATE 325 (65 FE) MG PO TABS
325.0000 mg | ORAL_TABLET | Freq: Three times a day (TID) | ORAL | Status: DC
  Administered 2016-07-14 – 2016-07-15 (×5): 325 mg via ORAL
  Filled 2016-07-13 (×5): qty 1

## 2016-07-13 SURGICAL SUPPLY — 49 items
BLADE SAGITTAL WIDE XTHICK NO (BLADE) ×3 IMPLANT
BLADE SURG SZ10 CARB STEEL (BLADE) ×3 IMPLANT
CANISTER SUCT 1200ML W/VALVE (MISCELLANEOUS) ×3 IMPLANT
CANISTER SUCT 3000ML PPV (MISCELLANEOUS) ×6 IMPLANT
CAPT HIP HEMI 1 ×3 IMPLANT
DRAPE IMP U-DRAPE 54X76 (DRAPES) ×3 IMPLANT
DRAPE INCISE IOBAN 66X60 STRL (DRAPES) ×3 IMPLANT
DRAPE SHEET LG 3/4 BI-LAMINATE (DRAPES) ×6 IMPLANT
DRAPE SURG 17X11 SM STRL (DRAPES) ×3 IMPLANT
DRAPE TABLE BACK 80X90 (DRAPES) ×3 IMPLANT
DRSG OPSITE POSTOP 4X10 (GAUZE/BANDAGES/DRESSINGS) ×3 IMPLANT
DRSG OPSITE POSTOP 4X8 (GAUZE/BANDAGES/DRESSINGS) ×3 IMPLANT
DURAPREP 26ML APPLICATOR (WOUND CARE) ×12 IMPLANT
ELECT BLADE 6.5 EXT (BLADE) ×3 IMPLANT
ELECT CAUTERY BLADE 6.4 (BLADE) ×3 IMPLANT
ELECT REM PT RETURN 9FT ADLT (ELECTROSURGICAL) ×3
ELECTRODE REM PT RTRN 9FT ADLT (ELECTROSURGICAL) ×1 IMPLANT
GAUZE PETRO XEROFOAM 1X8 (MISCELLANEOUS) ×6 IMPLANT
GAUZE SPONGE 4X4 12PLY STRL (GAUZE/BANDAGES/DRESSINGS) ×3 IMPLANT
GLOVE BIOGEL PI IND STRL 9 (GLOVE) ×1 IMPLANT
GLOVE BIOGEL PI INDICATOR 9 (GLOVE) ×2
GLOVE SURG 9.0 ORTHO LTXF (GLOVE) ×3 IMPLANT
GOWN STRL REUS TWL 2XL XL LVL4 (GOWN DISPOSABLE) ×3 IMPLANT
GOWN STRL REUS W/ TWL LRG LVL3 (GOWN DISPOSABLE) ×1 IMPLANT
GOWN STRL REUS W/TWL LRG LVL3 (GOWN DISPOSABLE) ×2
HANDPIECE INTERPULSE COAX TIP (DISPOSABLE) ×2
HEMOVAC 400ML (MISCELLANEOUS) ×3
HIP CAPITATED HEMI 1 ×1 IMPLANT
KIT DRAIN HEMOVAC JP 7FR 400ML (MISCELLANEOUS) ×1 IMPLANT
KIT RM TURNOVER STRD PROC AR (KITS) ×3 IMPLANT
NDL SAFETY 18GX1.5 (NEEDLE) ×3 IMPLANT
NEEDLE FILTER BLUNT 18X 1/2SAF (NEEDLE) ×2
NEEDLE FILTER BLUNT 18X1 1/2 (NEEDLE) ×1 IMPLANT
NEEDLE MAYO CATGUT SZ4 (NEEDLE) ×3 IMPLANT
NS IRRIG 1000ML POUR BTL (IV SOLUTION) ×3 IMPLANT
PACK HIP PROSTHESIS (MISCELLANEOUS) ×3 IMPLANT
PILLOW ABDUC SM (MISCELLANEOUS) ×3 IMPLANT
RETRIEVER SUT HEWSON (MISCELLANEOUS) ×3 IMPLANT
SET HNDPC FAN SPRY TIP SCT (DISPOSABLE) ×1 IMPLANT
SOL .9 NS 3000ML IRR  AL (IV SOLUTION) ×2
SOL .9 NS 3000ML IRR UROMATIC (IV SOLUTION) ×1 IMPLANT
STAPLER SKIN PROX 35W (STAPLE) ×3 IMPLANT
SUT MNCRL 3 0 RB1 (SUTURE) ×1 IMPLANT
SUT MONOCRYL 3 0 RB1 (SUTURE) ×2
SUT TICRON 2-0 30IN 311381 (SUTURE) ×24 IMPLANT
SUT VIC AB 0 CT1 36 (SUTURE) ×6 IMPLANT
SUT VIC AB 2-0 CT2 27 (SUTURE) ×9 IMPLANT
SYRINGE 10CC LL (SYRINGE) ×3 IMPLANT
TAPE MICROFOAM 4IN (TAPE) ×3 IMPLANT

## 2016-07-13 NOTE — Clinical Social Work Note (Signed)
Clinical Social Work Assessment  Patient Details  Name: Miguel Kennedy MRN: [id number] Date of Birth: Mar 24, 1931  Date of referral:  07/13/16               Reason for consult:  Facility Placement                Permission sought to share information with:    Permission granted to share information::     Name::        Agency::     Relationship::     Contact Information:     Housing/Transportation Living arrangements for the past 2 months:  Single Family Home Source of Information:  Patient, Spouse, Adult Children Patient Interpreter Needed:  None Criminal Activity/Legal Involvement Pertinent to Current Situation/Hospitalization:  No - Comment as needed Significant Relationships:  Adult Children, Spouse Lives with:  Spouse Do you feel safe going back to the place where you live?  Yes Need for family participation in patient care:  Yes (Comment)  Care giving concerns:  Patient lives in [place] with his wife Mikle Bosworth.    Social Worker assessment / plan:  Holiday representative (Twin) reviewed chart and noted that patient is having surgery today. CSW met with patient prior to surgery today and his wife Mikle Bosworth and daughter Jeannene Patella was at bedside. CSW introduced self and explained role of CSW department. Patient was alert and oriented and sitting up in the bed. Patient reported that he lives in [place] with his wife and had a fall today. CSW explained that PT will work with patient after surgery and make a recommendation of home health or SNF. CSW explained SNF process. Patient reported that he prefers to go home with home health. CSW will continue to follow and assist as needed.   Employment status:  Retired Nurse, adult PT Recommendations:  Not assessed at this time Information / Referral to community resources:  [place]  Patient/Family's Response to care:  Patient prefers to go home.   Patient/Family's Understanding of and Emotional  Response to Diagnosis, Current Treatment, and Prognosis:  Patient was very pleasant and thanked CSW for visit.   Emotional Assessment Appearance:  Appears stated age Attitude/Demeanor/Rapport:    Affect (typically observed):  Accepting, Adaptable, Pleasant Orientation:  Oriented to Self, Oriented to Place, Oriented to  Time, Oriented to Situation Alcohol / Substance use:  Not Applicable Psych involvement (Current and /or in the community):  No (Comment)  Discharge Needs  Concerns to be addressed:  Discharge Planning Concerns Readmission within the last 30 days:  No Current discharge risk:  Dependent with Mobility Barriers to Discharge:  Continued Medical Work up   [employer], Veronia Beets, LCSW 07/13/2016, 1:50 PM

## 2016-07-13 NOTE — Progress Notes (Signed)
Inpatient Diabetes Program Recommendations  AACE/ADA: New Consensus Statement on Inpatient Glycemic Control (2015)  Target Ranges:  Prepandial:   less than 140 mg/dL      Peak postprandial:   less than 180 mg/dL (1-2 hours)      Critically ill patients:  140 - 180 mg/dL  Results for Miguel Kennedy, Miguel Kennedy (MRN [id number]) as of 07/13/2016 09:22  Ref. Range 07/12/2016 22:50 07/13/2016 08:26  Glucose-Capillary Latest Ref Range: 65 - 99 mg/dL 75 85   Results for Miguel Kennedy, Miguel Kennedy (MRN [id number]) as of 07/13/2016 09:22  Ref. Range 07/12/2016 18:33 07/13/2016 05:53  Glucose Latest Ref Range: 65 - 99 mg/dL 010153 (H) 66   Review of Glycemic Control  Diabetes history: DM2 Outpatient Diabetes medications: Metformin 500 mg BID, Lantus 32 units QHS Current orders for Inpatient glycemic control: Lantus 32 units QHS, Novolog 0-9 units TID with meals, Novolog 0-5 units QHS  Inpatient Diabetes Program Recommendations: Insulin - Basal: Please consider decreasing Lantus to 26 units QHS (which is 80% of outpatient Lantus dose).  NOTE: Called to speak with patient over phone to verify DM medication but patient's daughter answered the phone and asked that I talk with her because her father could not hear well. Per patient's daughter, patient is taking Lantus 32 units QHS and Metformin 500 mg BID as prescribed and she notes that patient does not skip any insulin or Metformin doses. Patient's daughter reports that patient took his Lantus 32 units yesterday at home prior to coming to the hospital. Patient is NPO and is planned for surgery after lunch today.   Thanks, Orlando PennerMarie Jawara Latorre, RN, MSN, CDE Diabetes Coordinator Inpatient Diabetes Program [phone] (Team Pager from 8am to 5pm)

## 2016-07-13 NOTE — Op Note (Signed)
07/12/2016 - 07/13/2016  4:15 PM  PATIENT:  Miguel Kennedy   MRN: [id number]  PRE-OPERATIVE DIAGNOSIS:  right femoral neck hip fracture  POST-OPERATIVE DIAGNOSIS:  right femoral neck hip fracture  PROCEDURE:  Procedure(s): RIGHT HIP HEMIARTHROPLASTY   PREOPERATIVE INDICATIONS:    Miguel Kennedy is an 82 y.o. male who was admitted with a diagnosis of right hip fracture.  I have recommended surgical fixation with hemiarthroplasty for this injury. I have explained the surgery and the postoperative course to the patient and their family who have agreed with surgical management of this fracture.    The risks benefits and alternatives were discussed with the patient and their family including but not limited to the risks of  infection requiring removal of the prosthesis, bleeding requiring blood transfusion, nerve injury especially to the sciatic nerve leading to foot drop or lower extremity numbness, periprosthetic fracture, dislocation leg length discrepancy, change in lower extremity rotation persistent hip pain, loosening or failure of the components and the need for revision surgery. Medical risks include but are not limited to DVT and pulmonary embolism, myocardial infarction, stroke, pneumonia, respiratory failure and death.  OPERATIVE REPORT     SURGEON:  Thornton Park, MD    ASSISTANT:  Wyatt Portela, PA    ANESTHESIA:  General    COMPLICATIONS:  None.   SPECIMEN: Femoral head to pathology    COMPONENTS:  [employer]ker Accolade HFx femoral component size 4 with a 52 mm Unitrax head +0 neck adjustment sleeve.    PROCEDURE IN DETAIL:   The patient was met in the holding area and  identified.  The appropriate hip was identified and marked at the operative site after verbally confirming with the patient that this was the correct site of surgery.  The patient was then transported to the OR  and  underwent general anesthesia.  The patient was then placed in the lateral decubitus  position with the operative side up and secured on the operating room table with a pegboard and all bony prominences were adequately padded. This included an axillary roll and additional padding around the nonoperative leg to prevent compression to the common peroneal nerve.    The operative lower extremity was prepped and draped in a sterile fashion.  A time out was performed prior to incision to verify patient's name, date of birth, medical record number, correct site of surgery correct procedure to be performed. The timeout was also used to verify the patient received antibiotics now appropriate instruments, implants and radiographic studies were available in the room. Once all in attendance were in agreement case began.    A posterolateral approach was utilized via sharp dissection  carried down to the subcutaneous tissue.  Bleeding vessels were coagulated using electrocautery.  The fascia lata was identified and incised along the length of the skin incision.  The gluteus maximus muscle was then split in line with its fibers. Self-retaining retractors were  inserted.  With the hip internally rotated, the short external rotators  were identified and removed from the posterior attachment from the greater trochanter. The piriformis was tagged for later repair. The capsule was identified and a T-shaped capsulotomy was performed. The capsule was tagged with #2 Tycron for later repair.  The femoral neck fracture was exposed, and the femoral head was removed using a corkscrew device. This was measured to be 52 mm in diameter. The attention was then turned to proximal femur preparation.  An oscillating saw was used to perform a proximal  femoral osteotomy 1 fingerbreadth above the lesser trochanter. The trial 52 mm femoral head was placed into the acetabulum and had an excellent suction fit. The attention was then turned back to femoral preparation.    A femoral skid and Cobra retractor were placed under the  femoral neck to allow for adequate visualization. A box osteotome was used to make the initial entry into the proximal femur. A single hand reamer was used to prepare the femoral canal. A T-shaped femoral canal sounder was then used to ensure no penetration femoral cortex had occurred during reaming. The proximal femur was then sequentially broached by hand. A size 4 femoral trial was found to have best medial to lateral canal fit. Once adequate mediolateral canal fill was achieved the trial femoral broach, neck, and head was assembled and the hip was reduced. It was found to have excellent stability, equivalent leg lengths with functional range of motion. The trial components were then removed.  I copiously irrigated the femoral canal and then impacted the real femoral prosthesis into place into the appropriate version, slightly anteverted to the normal anatomy, and I impacted the actual 52 mm Unitrax head component with a +0 neck adjustment sleeve into place. The hip was then reduced and taken through functional range of motion and found to have excellent stability. Leg lengths were restored. The hip joint was copiously irrigated.   A soft tissue repair of the capsule and external rotators was performed using #2 Tycron.  Excellent posterior capsular and external rotator repair was achieved. The fascia lata was then closed with interrupted 0 Vicryl suture. The subcutaneous tissues were closed with 2-0 Vicryl and the skin approximated with staples.   The patient was then placed supine on the operative table. Leg lengths were checked clinically and found to be equivalent. An abduction pillow was placed between the lower extremities. The patient was then transferred to a hospital bed and brought to the PACU in stable condition. I was scrubbed and present the entire case and all sharp and instrument counts were correct at the conclusion of the case. I spoke with the patient's family in the postop consultation  room to let them case was completed without complication patient was stable in recovery room.   Timoteo Gaul, MD Orthopedic Surgeon

## 2016-07-13 NOTE — Progress Notes (Signed)
Subjective:  POST-OP CHECK:  Patient is seen in his hospital room with his family at the bedside. Patient reports pain as mild.  Patient states he is doing well.  Objective:   VITALS:   Vitals:   07/13/16 1638 07/13/16 1653 07/13/16 1713 07/13/16 1727  BP: 111/62 (!) 105/54 (!) 109/50   Pulse: (!) 103 (!) 105 (!) 104   Resp: 20 (!) 21    Temp:  97.3 F (36.3 C) 97.7 F (36.5 C)   TempSrc:   Oral   SpO2: 93% 92% 96% 96%  Weight:      Height:        PHYSICAL EXAM:  Right lower extremity patient's dressing is clean dry and intact. He has no significant swelling in his thigh compartments are soft and compressible. He has intact sensation light touch, intact motor function in palpable pedal pulses. Patient can dorsiflex and plantarflex his ankle and flex and extend his toes.   LABS  Results for orders placed or performed during the hospital encounter of 07/12/16 (from the past 24 hour(s))  APTT     Status: None   Collection Time: 07/12/16  6:33 PM  Result Value Ref Range   aPTT 31 24 - 36 seconds  Comprehensive metabolic panel     Status: Abnormal   Collection Time: 07/12/16  6:33 PM  Result Value Ref Range   Sodium 132 (L) 135 - 145 mmol/L   Potassium 4.4 3.5 - 5.1 mmol/L   Chloride 96 (L) 101 - 111 mmol/L   CO2 28 22 - 32 mmol/L   Glucose, Bld 153 (H) 65 - 99 mg/dL   BUN 18 6 - 20 mg/dL   Creatinine, Ser 1.61 0.61 - 1.24 mg/dL   Calcium 9.0 8.9 - 09.6 mg/dL   Total Protein 7.4 6.5 - 8.1 g/dL   Albumin 3.9 3.5 - 5.0 g/dL   AST 32 15 - 41 U/L   ALT 17 17 - 63 U/L   Alkaline Phosphatase 72 38 - 126 U/L   Total Bilirubin 0.5 0.3 - 1.2 mg/dL   GFR calc non Af Amer >60 >60 mL/min   GFR calc Af Amer >60 >60 mL/min   Anion gap 8 5 - 15  CBC WITH DIFFERENTIAL     Status: Abnormal   Collection Time: 07/12/16  6:33 PM  Result Value Ref Range   WBC 9.1 3.8 - 10.6 K/uL   RBC 4.49 4.40 - 5.90 MIL/uL   Hemoglobin 13.4 13.0 - 18.0 g/dL   HCT 04.5 (L) 40.9 - 81.1 %   MCV  88.5 80.0 - 100.0 fL   MCH 29.9 26.0 - 34.0 pg   MCHC 33.8 32.0 - 36.0 g/dL   RDW 91.4 (H) 78.2 - 95.6 %   Platelets 211 150 - 440 K/uL   Neutrophils Relative % 69 %   Neutro Abs 6.3 1.4 - 6.5 K/uL   Lymphocytes Relative 20 %   Lymphs Abs 1.8 1.0 - 3.6 K/uL   Monocytes Relative 7 %   Monocytes Absolute 0.6 0.2 - 1.0 K/uL   Eosinophils Relative 3 %   Eosinophils Absolute 0.3 0 - 0.7 K/uL   Basophils Relative 1 %   Basophils Absolute 0.1 0 - 0.1 K/uL  Protime-INR     Status: None   Collection Time: 07/12/16  6:33 PM  Result Value Ref Range   Prothrombin Time 13.2 11.4 - 15.2 seconds   INR 1.00   Type and screen  Status: None (Preliminary result)   Collection Time: 07/12/16  6:33 PM  Result Value Ref Range   ABO/RH(D) A POS    Antibody Screen POS    Sample Expiration 07/15/2016    Antibody Identification ANTI E    PT AG Type POSITIVE FOR c ANTIGEN    Unit Number [id number]    Blood Component Type RED CELLS,LR    Unit division 00    Status of Unit ALLOCATED    Transfusion Status OK TO TRANSFUSE    Crossmatch Result COMPATIBLE    Unit Number [id number]    Blood Component Type RED CELLS,LR    Unit division 00    Status of Unit ALLOCATED    Transfusion Status OK TO TRANSFUSE    Crossmatch Result COMPATIBLE   Urinalysis complete, with microscopic ([facility] only)     Status: Abnormal   Collection Time: 07/12/16 10:22 PM  Result Value Ref Range   Color, Urine YELLOW (A) YELLOW   APPearance CLEAR (A) CLEAR   Glucose, UA 50 (A) NEGATIVE mg/dL   Bilirubin Urine NEGATIVE NEGATIVE   Ketones, ur NEGATIVE NEGATIVE mg/dL   Specific Gravity, Urine 1.012 1.005 - 1.030   Hgb urine dipstick NEGATIVE NEGATIVE   pH 6.0 5.0 - 8.0   Protein, ur NEGATIVE NEGATIVE mg/dL   Nitrite NEGATIVE NEGATIVE   Leukocytes, UA NEGATIVE NEGATIVE   RBC / HPF 0-5 0 - 5 RBC/hpf   WBC, UA 0-5 0 - 5 WBC/hpf   Bacteria, UA NONE SEEN NONE SEEN   Squamous Epithelial / LPF NONE SEEN NONE SEEN  Surgical  pcr screen     Status: None   Collection Time: 07/12/16 10:22 PM  Result Value Ref Range   MRSA, PCR NEGATIVE NEGATIVE   Staphylococcus aureus NEGATIVE NEGATIVE  Glucose, capillary     Status: None   Collection Time: 07/12/16 10:50 PM  Result Value Ref Range   Glucose-Capillary 75 65 - 99 mg/dL   Comment 1 Notify RN   ABO/Rh     Status: None   Collection Time: 07/12/16 11:02 PM  Result Value Ref Range   ABO/RH(D) A POS   Basic metabolic panel     Status: Abnormal   Collection Time: 07/13/16  5:53 AM  Result Value Ref Range   Sodium 134 (L) 135 - 145 mmol/L   Potassium 3.9 3.5 - 5.1 mmol/L   Chloride 100 (L) 101 - 111 mmol/L   CO2 28 22 - 32 mmol/L   Glucose, Bld 66 65 - 99 mg/dL   BUN 14 6 - 20 mg/dL   Creatinine, Ser 3.080.72 0.61 - 1.24 mg/dL   Calcium 8.7 (L) 8.9 - 10.3 mg/dL   GFR calc non Af Amer >60 >60 mL/min   GFR calc Af Amer >60 >60 mL/min   Anion gap 6 5 - 15  CBC     Status: Abnormal   Collection Time: 07/13/16  5:53 AM  Result Value Ref Range   WBC 9.6 3.8 - 10.6 K/uL   RBC 4.41 4.40 - 5.90 MIL/uL   Hemoglobin 12.9 (L) 13.0 - 18.0 g/dL   HCT 65.738.1 (L) 84.640.0 - 96.252.0 %   MCV 86.3 80.0 - 100.0 fL   MCH 29.3 26.0 - 34.0 pg   MCHC 33.9 32.0 - 36.0 g/dL   RDW 95.214.9 (H) 84.111.5 - 32.414.5 %   Platelets 183 150 - 440 K/uL  Glucose, capillary     Status: None   Collection Time: 07/13/16  8:26 AM  Result Value Ref Range   Glucose-Capillary 85 65 - 99 mg/dL  Glucose, capillary     Status: Abnormal   Collection Time: 07/13/16 12:52 PM  Result Value Ref Range   Glucose-Capillary 123 (H) 65 - 99 mg/dL  POCT CBG (Fasting - Glucose)     Status: Abnormal   Collection Time: 07/13/16  1:34 PM  Result Value Ref Range   Glucose Fasting, POC 123 (A) 70 - 99 mg/dL  Glucose, capillary     Status: None   Collection Time: 07/13/16  4:22 PM  Result Value Ref Range   Glucose-Capillary 96 65 - 99 mg/dL    Dg Chest Port 1 View  Result Date: 07/12/2016 CLINICAL DATA:  Preop right hip  fracture. Hypertension and tobacco use. EXAM: PORTABLE CHEST 1 VIEW COMPARISON:  12/30/2009 FINDINGS: The lungs are hyperinflated without pneumonic consolidation. Bilateral chronic appearing rib fractures are noted involving the left fourth and fifth ribs and right fifth, sixth and seventh ribs. Patient is status post CABG. There is aortic atherosclerosis. No effusion or pneumothorax. Pulmonary vasculature is unremarkable. IMPRESSION: No acute pneumonic consolidation. Hyperinflated lungs consistent with COPD. Electronically Signed   By: Tollie Ethavid  Kwon M.D.   On: 07/12/2016 19:19   Dg Hip Port Unilat With Pelvis 1v Right  Result Date: 07/13/2016 CLINICAL DATA:  Postop hip fracture EXAM: DG HIP (WITH OR WITHOUT PELVIS) 1V PORT RIGHT COMPARISON:  07/12/2016 FINDINGS: Two views of the right hip submitted. The patient is status post intraoperative repair of right femoral neck fracture. There is right hip prosthesis with anatomic alignment. IMPRESSION: Right hip prosthesis with anatomic alignment. Electronically Signed   By: Natasha MeadLiviu  Pop M.D.   On: 07/13/2016 17:00   Dg Hip Unilat With Pelvis 2-3 Views Right  Result Date: 07/12/2016 CLINICAL DATA:  Patient fell complaining of right lower extremity pain. EXAM: DG HIP (WITH OR WITHOUT PELVIS) 2-3V RIGHT COMPARISON:  10/25/2006 KUB FINDINGS: There is an acute, closed, varus angulated transcervical fracture of the right femur sparing the trochanters. The bony pelvis and contralateral hip are intact. There is aortoiliac and branch vessel atherosclerosis. IMPRESSION: Acute, closed, varus angulated transcervical fracture of the proximal right femur. Electronically Signed   By: Tollie Ethavid  Kwon M.D.   On: 07/12/2016 19:15    Assessment/Plan: Day of Surgery   Active Problems:   Femur fracture, right [facility](HCC)  Patient is doing well postop. He received 24 hours of postop antibiotics. He'll begin Lovenox for DVT prophylaxis tomorrow. Labs will be rechecked in the morning. He'll  begin physical therapy tomorrow. He is weightbearing as tolerated on the right lower extremity with posterior hip precautions. Patient's Foley catheter will be removed tomorrow. I reviewed the postoperative x-ray which shows the hemiarthroplasty prosthesis is located within the acetabulum and there is no evidence of fracture. I answered all questions by the patient and his family.    Juanell FairlyKRASINSKI, Kaijah Abts , MD 07/13/2016, 5:40 PM

## 2016-07-13 NOTE — NC FL2 (Signed)
[place] MEDICAID FL2 LEVEL OF CARE SCREENING TOOL     IDENTIFICATION  Patient Name: Miguel Kennedy Birthdate: [birth year] Sex: male Admission Date (Current Location): 07/12/2016  [place]   Facility and Address:  [facility], [address], [place], [place] [address]      Provider Number: [id number]  Attending Physician Name and Address:  Katha HammingSnehalatha Konidena, MD  Relative Name and Phone Number:       Current Level of Care: Hospital Recommended Level of Care: Skilled Nursing Facility Prior Approval Number:    Date Approved/Denied:   PASRR Number:  [id number])  Discharge Plan: SNF    Current Diagnoses: Patient Active Problem List   Diagnosis Date Noted  . Femur fracture, right (HCC) 07/12/2016    Orientation RESPIRATION BLADDER Height & Weight     Self, Time, Situation, Place  O2 (3 Liters Oxygen ) Continent Weight: 140 lb (63.5 kg) Height:  6' (182.9 cm)  BEHAVIORAL SYMPTOMS/MOOD NEUROLOGICAL BOWEL NUTRITION STATUS   (none)  (none) Continent Diet (NPO for surgery to be advanced )  AMBULATORY STATUS COMMUNICATION OF NEEDS Skin   Extensive Assist Verbally Surgical wounds                       Personal Care Assistance Level of Assistance  Bathing, Feeding, Dressing Bathing Assistance: Limited assistance Feeding assistance: Independent Dressing Assistance: Limited assistance     Functional Limitations Info  Sight, Hearing, Speech Sight Info: Adequate Hearing Info: Adequate Speech Info: Adequate    SPECIAL CARE FACTORS FREQUENCY  PT (By licensed PT), OT (By licensed OT)     PT Frequency:  (5) OT Frequency:  (5)            Contractures      Additional Factors Info  Code Status, Allergies Code Status Info:  (Full Code. ) Allergies Info:  (No Known Allergies. )           Current Medications (07/13/2016):  This is the current hospital active medication list Current  Facility-Administered Medications  Medication Dose Route Frequency Provider Last Rate Last Dose  . 0.9 %  sodium chloride infusion   Intravenous Continuous Yevette EdwardsJames G Adams, MD 50 mL/hr at 07/13/16 1238    . [MAR Hold] acetaminophen (TYLENOL) tablet 650 mg  650 mg Oral Q6H PRN Wyatt Hasteavid K Hower, MD       Or  . Mitzi Hansen[MAR Hold] acetaminophen (TYLENOL) suppository 650 mg  650 mg Rectal Q6H PRN Wyatt Hasteavid K Hower, MD      . Mitzi Hansen[MAR Hold] aspirin chewable tablet 81 mg  81 mg Oral Daily Wyatt Hasteavid K Hower, MD      . ceFAZolin (ANCEF) 2-4 GM/100ML-% IVPB           . ceFAZolin (ANCEF) IVPB 2g/100 mL premix  2 g Intravenous 30 min Pre-Op Juanell FairlyKevin Krasinski, MD      . dextrose 5 %-0.45 % sodium chloride infusion   Intravenous Continuous Wyatt Hasteavid K Hower, MD 50 mL/hr at 07/12/16 2127 1,000 mL at 07/12/16 2127  . [MAR Hold] docusate sodium (COLACE) capsule 100 mg  100 mg Oral BID Wyatt Hasteavid K Hower, MD   100 mg at 07/12/16 2253  . [MAR Hold] insulin aspart (novoLOG) injection 0-5 Units  0-5 Units Subcutaneous QHS Wyatt Hasteavid K Hower, MD      . Mitzi Hansen[MAR Hold] insulin aspart (novoLOG) injection 0-9 Units  0-9 Units Subcutaneous TID WC Wyatt Hasteavid K Hower, MD      . [  MAR Hold] insulin glargine (LANTUS) injection 26 Units  26 Units Subcutaneous QHS Katha HammingSnehalatha Konidena, MD      . Mitzi Hansen[MAR Hold] methocarbamol (ROBAXIN) tablet 500 mg  500 mg Oral Q6H PRN Juanell FairlyKevin Krasinski, MD   500 mg at 07/12/16 2253   Or  . [MAR Hold] methocarbamol (ROBAXIN) 500 mg in dextrose 5 % 50 mL IVPB  500 mg Intravenous Q6H PRN Juanell FairlyKevin Krasinski, MD      . Mitzi Hansen[MAR Hold] metoprolol succinate (TOPROL-XL) 24 hr tablet 50 mg  50 mg Oral Daily Wyatt Hasteavid K Hower, MD      . Mitzi Hansen[MAR Hold] morphine 4 MG/ML injection 2 mg  2 mg Intravenous Q4H PRN Wyatt Hasteavid K Hower, MD      . Mitzi Hansen[MAR Hold] ondansetron [facility](ZOFRAN) tablet 4 mg  4 mg Oral Q6H PRN Wyatt Hasteavid K Hower, MD       Or  . Mitzi Hansen[MAR Hold] ondansetron [facility](ZOFRAN) injection 4 mg  4 mg Intravenous Q6H PRN Wyatt Hasteavid K Hower, MD      . Mitzi Hansen[MAR Hold] oxyCODONE (Oxy IR/ROXICODONE) immediate  release tablet 5 mg  5 mg Oral Q4H PRN Wyatt Hasteavid K Hower, MD      . Mitzi Hansen[MAR Hold] polyethylene glycol (MIRALAX / GLYCOLAX) packet 17 g  17 g Oral Daily PRN Wyatt Hasteavid K Hower, MD      . Mitzi Hansen[MAR Hold] rosuvastatin (CRESTOR) tablet 40 mg  40 mg Oral Daily Wyatt Hasteavid K Hower, MD       Facility-Administered Medications Ordered in Other Encounters  Medication Dose Route Frequency Provider Last Rate Last Dose  . fentaNYL (SUBLIMAZE) injection    Anesthesia Intra-op Stormy FabianLinda Curtis, CRNA   50 mcg at 07/13/16 1343     Discharge Medications: Please see discharge summary for a list of discharge medications.  Relevant Imaging Results:  Relevant Lab Results:   Additional Information  (SSN: [id number])  Xavius Spadafore, Darleen CrockerBailey M, LCSW

## 2016-07-13 NOTE — Anesthesia Preprocedure Evaluation (Signed)
Anesthesia Evaluation  Patient identified by MRN, date of birth, ID band Patient awake    Reviewed: Allergy & Precautions, H&P , NPO status , Patient's Chart, lab work & pertinent test results, reviewed documented beta blocker date and time   Airway Mallampati: II  TM Distance: >3 FB Neck ROM: full    Dental  (+) Teeth Intact, Poor Dentition   Pulmonary neg pulmonary ROS, Current Smoker,    Pulmonary exam normal        Cardiovascular hypertension, negative cardio ROS Normal cardiovascular exam Rhythm:regular Rate:Normal     Neuro/Psych negative neurological ROS  negative psych ROS   GI/Hepatic negative GI ROS, Neg liver ROS,   Endo/Other  negative endocrine ROSdiabetes  Renal/GU negative Renal ROS  negative genitourinary   Musculoskeletal   Abdominal   Peds  Hematology negative hematology ROS (+)   Anesthesia Other Findings Past Medical History: No date: ACS (acute coronary syndrome) (HCC)     Comment: s/p CABG in 2011 No date: Diabetes mellitus without complication (HCC) No date: Hypertension Past Surgical History: No date: APPENDECTOMY 2011: CORONARY ARTERY BYPASS GRAFT BMI    Body Mass Index:  18.99 kg/m     Reproductive/Obstetrics negative OB ROS                             Anesthesia Physical Anesthesia Plan  ASA: III and emergent  Anesthesia Plan: General ETT   Post-op Pain Management:    Induction:   Airway Management Planned:   Additional Equipment:   Intra-op Plan:   Post-operative Plan:   Informed Consent: I have reviewed the patients History and Physical, chart, labs and discussed the procedure including the risks, benefits and alternatives for the proposed anesthesia with the patient or authorized representative who has indicated his/her understanding and acceptance.   Dental Advisory Given  Plan Discussed with: CRNA  Anesthesia Plan Comments:          Anesthesia Quick Evaluation

## 2016-07-13 NOTE — Anesthesia Procedure Notes (Signed)
Procedure Name: Intubation Date/Time: 07/13/2016 1:54 PM Performed by: Stormy FabianURTIS, Eddi Hymes Pre-anesthesia Checklist: Patient identified, Patient being monitored, Timeout performed, Emergency Drugs available and Suction available Patient Re-evaluated:Patient Re-evaluated prior to inductionOxygen Delivery Method: Circle system utilized Preoxygenation: Pre-oxygenation with 100% oxygen Intubation Type: IV induction Ventilation: Mask ventilation without difficulty Laryngoscope Size: Mac and 3 Grade View: Grade I Tube type: Oral Tube size: 7.5 mm Number of attempts: 1 Airway Equipment and Method: Stylet Placement Confirmation: ETT inserted through vocal cords under direct vision,  positive ETCO2 and breath sounds checked- equal and bilateral Secured at: 23 cm Tube secured with: Tape Dental Injury: Teeth and Oropharynx as per pre-operative assessment

## 2016-07-13 NOTE — Transfer of Care (Signed)
Immediate Anesthesia Transfer of Care Note  Patient: Miguel Kennedy  Procedure(s) Performed: Procedure(s): ARTHROPLASTY BIPOLAR HIP (HEMIARTHROPLASTY) (Right)  Patient Location: PACU  Anesthesia Type:General  Level of Consciousness: sedated  Airway & Oxygen Therapy: Patient Spontanous Breathing and Patient connected to face mask oxygen  Post-op Assessment: Report given to RN and Post -op Vital signs reviewed and stable  Post vital signs: Reviewed and stable  Last Vitals:  Vitals:   07/13/16 1241 07/13/16 1609  BP: (!) 111/58 (!) 127/53  Pulse: (!) 124 100  Resp: 16 (!) 24  Temp: 36.6 C 36.4 C    Complications: No apparent anesthesia complications

## 2016-07-13 NOTE — Progress Notes (Signed)
[facility] [place] [facility]   PATIENT NAME: Miguel Kennedy    MR#:  [id number]  DATE OF BIRTH:  1930-12-22  SUBJECTIVE: admitted for fall, found to have a right hip fracture . seen by orthopedic, patient may go for surgery later tonight or tomorrow morning.  Says That pain is well controlled. Denies any other complaints.  CHIEF COMPLAINT:   Chief Complaint  Patient presents with  . Leg Pain  . Fall    REVIEW OF SYSTEMS:   ROS CONSTITUTIONAL: No fever, fatigue or weakness.  EYES: No blurred or double vision.  EARS, NOSE, AND THROAT: No tinnitus or ear pain.  RESPIRATORY: No cough, shortness of breath, wheezing or hemoptysis.  CARDIOVASCULAR: No chest pain, orthopnea, edema.  GASTROINTESTINAL: No nausea, vomiting, diarrhea or abdominal pain.  GENITOURINARY: No dysuria, hematuria.  ENDOCRINE: No polyuria, nocturia,  HEMATOLOGY: No anemia, easy bruising or bleeding SKIN: No rash or lesion. MUSCULOSKELETAL: Right hip pain.  NEUROLOGIC: No tingling, numbness, weakness.  PSYCHIATRY: No anxiety or depression.   DRUG ALLERGIES:  No Known Allergies  VITALS:  Blood pressure 120/66, pulse (!) 127, temperature 99 F (37.2 C), temperature source Oral, resp. rate (!) 2, height 6' (1.829 m), weight 63.5 kg (140 lb), SpO2 93 %.  PHYSICAL EXAMINATION:  GENERAL:  82 y.o.-year-old patient lying in the bed with no acute distress.  EYES: Pupils equal, round, reactive to light and accommodation. No scleral icterus. Extraocular muscles intact.  HEENT: Head atraumatic, normocephalic. Oropharynx and nasopharynx clear.  NECK:  Supple, no jugular venous distention. No thyroid enlargement, no tenderness.  LUNGS: Normal breath sounds bilaterally, no wheezing, rales,rhonchi or crepitation. No use of accessory muscles of respiration.  CARDIOVASCULAR: S1, S2 normal. No murmurs, rubs, or gallops.  ABDOMEN: Soft, nontender, nondistended. Bowel sounds present. No  organomegaly or mass.  EXTREMITIES;shortening,external rotation of the right leg. He has palpable pulses, intact sensations to light touch, intact motor function distally.Marland Kitchen.  NEUROLOGIC: Cranial nerves II through XII are intact. Muscle strength 5/5 in all extremities. Sensation intact. Gait not checked.  PSYCHIATRIC: The patient is alert and oriented x 3.  SKIN: No obvious rash, lesion, or ulcer.    LABORATORY PANEL:   CBC  Recent Labs Lab 07/13/16 0553  WBC 9.6  HGB 12.9*  HCT 38.1*  PLT 183   ------------------------------------------------------------------------------------------------------------------  Chemistries   Recent Labs Lab 07/12/16 1833 07/13/16 0553  NA 132* 134*  K 4.4 3.9  CL 96* 100*  CO2 28 28  GLUCOSE 153* 66  BUN 18 14  CREATININE 0.75 0.72  CALCIUM 9.0 8.7*  AST 32  --   ALT 17  --   ALKPHOS 72  --   BILITOT 0.5  --    ------------------------------------------------------------------------------------------------------------------  Cardiac Enzymes No results for input(s): TROPONINI in the last 168 hours. ------------------------------------------------------------------------------------------------------------------  RADIOLOGY:  Dg Chest Port 1 View  Result Date: 07/12/2016 CLINICAL DATA:  Preop right hip fracture. Hypertension and tobacco use. EXAM: PORTABLE CHEST 1 VIEW COMPARISON:  12/30/2009 FINDINGS: The lungs are hyperinflated without pneumonic consolidation. Bilateral chronic appearing rib fractures are noted involving the left fourth and fifth ribs and right fifth, sixth and seventh ribs. Patient is status post CABG. There is aortic atherosclerosis. No effusion or pneumothorax. Pulmonary vasculature is unremarkable. IMPRESSION: No acute pneumonic consolidation. Hyperinflated lungs consistent with COPD. Electronically Signed   By: Tollie Ethavid  Kwon M.D.   On: 07/12/2016 19:19   Dg Hip Unilat With Pelvis 2-3 Views Right  Result  Date:  07/12/2016 CLINICAL DATA:  Patient fell complaining of right lower extremity pain. EXAM: DG HIP (WITH OR WITHOUT PELVIS) 2-3V RIGHT COMPARISON:  10/25/2006 KUB FINDINGS: There is an acute, closed, varus angulated transcervical fracture of the right femur sparing the trochanters. The bony pelvis and contralateral hip are intact. There is aortoiliac and branch vessel atherosclerosis. IMPRESSION: Acute, closed, varus angulated transcervical fracture of the proximal right femur. Electronically Signed   By: Tollie Ethavid  Kwon M.D.   On: 07/12/2016 19:15    EKG:   Orders placed or performed during the hospital encounter of 07/12/16  . ED EKG  . ED EKG    ASSESSMENT AND PLAN:   #1 .displaced right femoral neck fracture: Seen by orthopedic, possible right hip hemiarthroplasty today or tomorrow morning. Continue pain medicine, IV hydration.  #2/tachycardia secondary to pain. Continue pain medicine, IV hydration. Continue metoprolol XL 50 MG daily.  #3 diabetes mellitus type 2: Blood sugars are borderline, decreased the Lantus dose as per  diabetes coordinator recommendation. 4. history of CAD, CABG: Continue  Bblockers, statins. All the records are reviewed and case discussed with Care Management/Social Workerr. Management plans discussed with the patient, family and they are in agreement.  CODE STATUS: full  TOTAL TIME TAKING CARE OF THIS PATIENT:35 minutes.   POSSIBLE D/C IN 1-2DAYS, DEPENDING ON CLINICAL CONDITION.   Katha HammingKONIDENA,Cristy Colmenares M.D on 07/13/2016 at 12:25 PM  Between 7am to 6pm - Pager - [phone]  After 6pm go to [email or web address] - password [email or web address]  [facility]  Office  [phone]  CC: Primary care physician; Maryruth HancockSALLIE PATEL, MD   Note: This dictation was prepared with Dragon dictation along with smaller phrase technology. Any transcriptional errors that result from this process are unintentional.

## 2016-07-13 NOTE — OR Nursing (Signed)
Dr Pernell DupreAdams notified of pt hr 124 and has been 110 and above several times in last eight hours - also that metoprolol was held this am - will treat in OR

## 2016-07-14 ENCOUNTER — Encounter: Payer: Self-pay | Admitting: Orthopedic Surgery

## 2016-07-14 LAB — CBC
HEMATOCRIT: 32.8 % — AB (ref 40.0–52.0)
HEMOGLOBIN: 11.2 g/dL — AB (ref 13.0–18.0)
MCH: 29.7 pg (ref 26.0–34.0)
MCHC: 34.3 g/dL (ref 32.0–36.0)
MCV: 86.5 fL (ref 80.0–100.0)
Platelets: 156 10*3/uL (ref 150–440)
RBC: 3.79 MIL/uL — AB (ref 4.40–5.90)
RDW: 15.3 % — ABNORMAL HIGH (ref 11.5–14.5)
WBC: 10.9 10*3/uL — AB (ref 3.8–10.6)

## 2016-07-14 LAB — GLUCOSE, CAPILLARY
GLUCOSE-CAPILLARY: 198 mg/dL — AB (ref 65–99)
GLUCOSE-CAPILLARY: 296 mg/dL — AB (ref 65–99)
GLUCOSE-CAPILLARY: 163 mg/dL — AB (ref 65–99)
Glucose-Capillary: 156 mg/dL — ABNORMAL HIGH (ref 65–99)

## 2016-07-14 LAB — URINE CULTURE: CULTURE: NO GROWTH

## 2016-07-14 LAB — BASIC METABOLIC PANEL
ANION GAP: 6 (ref 5–15)
BUN: 17 mg/dL (ref 6–20)
CO2: 26 mmol/L (ref 22–32)
Calcium: 7.6 mg/dL — ABNORMAL LOW (ref 8.9–10.3)
Chloride: 98 mmol/L — ABNORMAL LOW (ref 101–111)
Creatinine, Ser: 0.82 mg/dL (ref 0.61–1.24)
GFR calc Af Amer: >60 mL/min (ref >60)
GFR calc non Af Amer: >60 mL/min (ref >60)
GLUCOSE: 218 mg/dL — AB (ref 65–99)
POTASSIUM: 4 mmol/L (ref 3.5–5.1)
Sodium: 130 mmol/L — ABNORMAL LOW (ref 135–145)

## 2016-07-14 LAB — HEMOGLOBIN A1C
HEMOGLOBIN A1C: 7.6 % — AB (ref 4.8–5.6)
MEAN PLASMA GLUCOSE: 171 mg/dL

## 2016-07-14 NOTE — Progress Notes (Signed)
[facility] - [place] [facility]   PATIENT NAME: Miguel Kennedy    MR#:  [id number]  DATE OF BIRTH:  [birth year]  SUBJECTIVE: admitted for fall, found to have a right hip fracture .s/p right hip hemiarthoplasty yesterday.tolerated procedure well.some pain.  CHIEF COMPLAINT:   Chief Complaint  Patient presents with  . Leg Pain  . Fall    REVIEW OF SYSTEMS:   ROS CONSTITUTIONAL: No fever, fatigue or weakness.  EYES: No blurred or double vision.  EARS, NOSE, AND THROAT: No tinnitus or ear pain.  RESPIRATORY: No cough, shortness of breath, wheezing or hemoptysis.  CARDIOVASCULAR: No chest pain, orthopnea, edema.  GASTROINTESTINAL: No nausea, vomiting, diarrhea or abdominal pain.  GENITOURINARY: No dysuria, hematuria.  ENDOCRINE: No polyuria, nocturia,  HEMATOLOGY: No anemia, easy bruising or bleeding SKIN: No rash or lesion. MUSCULOSKELETAL: Right hip pain.  NEUROLOGIC: No tingling, numbness, weakness.  PSYCHIATRY: No anxiety or depression.   DRUG ALLERGIES:  No Known Allergies  VITALS:  Blood pressure (!) 110/58, pulse (!) 106, temperature 98.4 F (36.9 C), temperature source Oral, resp. rate 16, height 6' (1.829 m), weight 63.5 kg (140 lb), SpO2 91 %.  PHYSICAL EXAMINATION:  GENERAL:  82 y.o.-year-old patient lying in the bed with no acute distress.  EYES: Pupils equal, round, reactive to light and accommodation. No scleral icterus. Extraocular muscles intact.  HEENT: Head atraumatic, normocephalic. Oropharynx and nasopharynx clear.  NECK:  Supple, no jugular venous distention. No thyroid enlargement, no tenderness.  LUNGS: Normal breath sounds bilaterally, no wheezing, rales,rhonchi or crepitation. No use of accessory muscles of respiration.  CARDIOVASCULAR: S1, S2 normal. No murmurs, rubs, or gallops.  ABDOMEN: Soft, nontender, nondistended. Bowel sounds present. No organomegaly or mass.  EXTREMITIES;s/p surgery of right hip. NEUROLOGIC:  Cranial nerves II through XII are intact. Muscle strength 5/5 in all extremities. Sensation intact. Gait not checked.  PSYCHIATRIC: The patient is alert and oriented x 3.  SKIN: No obvious rash, lesion, or ulcer.    LABORATORY PANEL:   CBC  Recent Labs Lab 07/14/16 0301  WBC 10.9*  HGB 11.2*  HCT 32.8*  PLT 156   ------------------------------------------------------------------------------------------------------------------  Chemistries   Recent Labs Lab 07/12/16 1833  07/14/16 0301  NA 132*  < > 130*  K 4.4  < > 4.0  CL 96*  < > 98*  CO2 28  < > 26  GLUCOSE 153*  < > 218*  BUN 18  < > 17  CREATININE 0.75  < > 0.82  CALCIUM 9.0  < > 7.6*  AST 32  --   --   ALT 17  --   --   ALKPHOS 72  --   --   BILITOT 0.5  --   --   < > = values in this interval not displayed. ------------------------------------------------------------------------------------------------------------------  Cardiac Enzymes No results for input(s): TROPONINI in the last 168 hours. ------------------------------------------------------------------------------------------------------------------  RADIOLOGY:  Dg Pelvis Portable  Result Date: 07/13/2016 CLINICAL DATA:  Status post right hip replacement EXAM: PORTABLE PELVIS 1-2 VIEWS COMPARISON:  07/13/2016 FINDINGS: Cutaneous staples are in place over the lateral aspect of the hip. The patient is status post right hip replacement with anatomic alignment. Pubic symphysis appears intact. Extensive vascular calcifications. Radiopaque artifact over the right hip and proximal thigh IMPRESSION: Status post right hip arthroplasty with anatomic alignment. Radiopaque material over the hip and proximal thigh are suspected to be external to the patient/artifact. Electronically Signed   By: Adrian ProwsKim  Fujinaga M.D.  On: 07/13/2016 19:19   Dg Chest Port 1 View  Result Date: 07/12/2016 CLINICAL DATA:  Preop right hip fracture. Hypertension and tobacco use. EXAM:  PORTABLE CHEST 1 VIEW COMPARISON:  12/30/2009 FINDINGS: The lungs are hyperinflated without pneumonic consolidation. Bilateral chronic appearing rib fractures are noted involving the left fourth and fifth ribs and right fifth, sixth and seventh ribs. Patient is status post CABG. There is aortic atherosclerosis. No effusion or pneumothorax. Pulmonary vasculature is unremarkable. IMPRESSION: No acute pneumonic consolidation. Hyperinflated lungs consistent with COPD. Electronically Signed   By: Tollie Ethavid  Kwon M.D.   On: 07/12/2016 19:19   Dg Hip Port Unilat With Pelvis 1v Right  Result Date: 07/13/2016 CLINICAL DATA:  Postop hip fracture EXAM: DG HIP (WITH OR WITHOUT PELVIS) 1V PORT RIGHT COMPARISON:  07/12/2016 FINDINGS: Two views of the right hip submitted. The patient is status post intraoperative repair of right femoral neck fracture. There is right hip prosthesis with anatomic alignment. IMPRESSION: Right hip prosthesis with anatomic alignment. Electronically Signed   By: Natasha MeadLiviu  Pop M.D.   On: 07/13/2016 17:00   Dg Hip Unilat With Pelvis 2-3 Views Right  Result Date: 07/12/2016 CLINICAL DATA:  Patient fell complaining of right lower extremity pain. EXAM: DG HIP (WITH OR WITHOUT PELVIS) 2-3V RIGHT COMPARISON:  10/25/2006 KUB FINDINGS: There is an acute, closed, varus angulated transcervical fracture of the right femur sparing the trochanters. The bony pelvis and contralateral hip are intact. There is aortoiliac and branch vessel atherosclerosis. IMPRESSION: Acute, closed, varus angulated transcervical fracture of the proximal right femur. Electronically Signed   By: Tollie Ethavid  Kwon M.D.   On: 07/12/2016 19:15    EKG:   Orders placed or performed in visit on 12/30/09  . EKG 12-Lead    ASSESSMENT AND PLAN:   #1 .displaced right femoral neck fracture: s/p right hip hemi arthroplasty,continue PT,lovenox,,robaxin,,norco.PT recommends SNF placement,  #2/.hypoxia on RA requiring 02  likley due to  underlying copd;check 02 need at discharge;no wheezing now;  #3 diabetes mellitus type 2: continue SSI and levemir 4. history of CAD, CABG: Continue  Bblockers, statins.  D/w family All the records are reviewed and case discussed with Care Management/Social Workerr. Management plans discussed with the patient, family and they are in agreement.  CODE STATUS: full  TOTAL TIME TAKING CARE OF THIS PATIENT:35 minutes.   POSSIBLE D/C IN 1-2DAYS, DEPENDING ON CLINICAL CONDITION.   Katha HammingKONIDENA,Omarie Parcell M.D on 07/14/2016 at 5:44 PM  Between 7am to 6pm - Pager - [phone]  After 6pm go to [email or web address] - password [email or web address]  [facility]  Office  [phone]  CC: Primary care physician; Maryruth HancockSALLIE PATEL, MD   Note: This dictation was prepared with Dragon dictation along with smaller phrase technology. Any transcriptional errors that result from this process are unintentional.

## 2016-07-14 NOTE — Progress Notes (Signed)
PT is recommending SNF. Clinical Social Worker (CSW) met with patient and made him aware of above. Patient is agreeable to SNF search and prefers [facility].   CSW presented bed offers. Patient chose [facility]. [place] admissions coordinator at [facility] is aware of accepted bed offer. CSW will continue to follow and assist as needed.   [employer], LCSW [phone]

## 2016-07-14 NOTE — Progress Notes (Signed)
Subjective:  Postop day #1 status post right hip hemiarthroplasty. Patient reports pain as mild.  Patient states he has no chest pain shortness breath or abdominal pain. Patient is up out of bed to a chair surrounding by his family. He has no other complaints.  Objective:   VITALS:   Vitals:   07/13/16 2040 07/14/16 0011 07/14/16 0439 07/14/16 0851  BP: (!) 88/48 (!) 116/51 105/62 (!) 106/56  Pulse: (!) 120 (!) 109 (!) 120 (!) 117  Resp: 18 18 18 16   Temp: 98.5 F (36.9 C) 98.7 F (37.1 C) 98.7 F (37.1 C) 99.3 F (37.4 C)  TempSrc: Oral Oral Oral Oral  SpO2: 95% 98% 94% 95%  Weight:      Height:        PHYSICAL EXAM: Right lower extremity: She wearing TED stockings and foot pumps. Neurovascular intact Sensation intact distally Intact pulses distally Dorsiflexion/Plantar flexion intact Incision: dressing C/D/I No cellulitis present Compartment soft  LABS  Results for orders placed or performed during the hospital encounter of 07/12/16 (from the past 24 hour(s))  Glucose, capillary     Status: Abnormal   Collection Time: 07/13/16 12:52 PM  Result Value Ref Range   Glucose-Capillary 123 (H) 65 - 99 mg/dL  POCT CBG (Fasting - Glucose)     Status: Abnormal   Collection Time: 07/13/16  1:34 PM  Result Value Ref Range   Glucose Fasting, POC 123 (A) 70 - 99 mg/dL  Glucose, capillary     Status: None   Collection Time: 07/13/16  4:22 PM  Result Value Ref Range   Glucose-Capillary 96 65 - 99 mg/dL  Glucose, capillary     Status: Abnormal   Collection Time: 07/13/16  9:38 PM  Result Value Ref Range   Glucose-Capillary 355 (H) 65 - 99 mg/dL  CBC     Status: Abnormal   Collection Time: 07/14/16  3:01 AM  Result Value Ref Range   WBC 10.9 (H) 3.8 - 10.6 K/uL   RBC 3.79 (L) 4.40 - 5.90 MIL/uL   Hemoglobin 11.2 (L) 13.0 - 18.0 g/dL   HCT 16.132.8 (L) 09.640.0 - 04.552.0 %   MCV 86.5 80.0 - 100.0 fL   MCH 29.7 26.0 - 34.0 pg   MCHC 34.3 32.0 - 36.0 g/dL   RDW 40.915.3 (H) 81.111.5 - 91.414.5  %   Platelets 156 150 - 440 K/uL  Basic metabolic panel     Status: Abnormal   Collection Time: 07/14/16  3:01 AM  Result Value Ref Range   Sodium 130 (L) 135 - 145 mmol/L   Potassium 4.0 3.5 - 5.1 mmol/L   Chloride 98 (L) 101 - 111 mmol/L   CO2 26 22 - 32 mmol/L   Glucose, Bld 218 (H) 65 - 99 mg/dL   BUN 17 6 - 20 mg/dL   Creatinine, Ser 7.820.82 0.61 - 1.24 mg/dL   Calcium 7.6 (L) 8.9 - 10.3 mg/dL   GFR calc non Af Amer >60 >60 mL/min   GFR calc Af Amer >60 >60 mL/min   Anion gap 6 5 - 15  Glucose, capillary     Status: Abnormal   Collection Time: 07/14/16  8:49 AM  Result Value Ref Range   Glucose-Capillary 156 (H) 65 - 99 mg/dL   Comment 1 Notify RN     Dg Pelvis Portable  Result Date: 07/13/2016 CLINICAL DATA:  Status post right hip replacement EXAM: PORTABLE PELVIS 1-2 VIEWS COMPARISON:  07/13/2016 FINDINGS: Cutaneous staples are in  place over the lateral aspect of the hip. The patient is status post right hip replacement with anatomic alignment. Pubic symphysis appears intact. Extensive vascular calcifications. Radiopaque artifact over the right hip and proximal thigh IMPRESSION: Status post right hip arthroplasty with anatomic alignment. Radiopaque material over the hip and proximal thigh are suspected to be external to the patient/artifact. Electronically Signed   By: Jasmine PangKim  Fujinaga M.D.   On: 07/13/2016 19:19   Dg Chest Port 1 View  Result Date: 07/12/2016 CLINICAL DATA:  Preop right hip fracture. Hypertension and tobacco use. EXAM: PORTABLE CHEST 1 VIEW COMPARISON:  12/30/2009 FINDINGS: The lungs are hyperinflated without pneumonic consolidation. Bilateral chronic appearing rib fractures are noted involving the left fourth and fifth ribs and right fifth, sixth and seventh ribs. Patient is status post CABG. There is aortic atherosclerosis. No effusion or pneumothorax. Pulmonary vasculature is unremarkable. IMPRESSION: No acute pneumonic consolidation. Hyperinflated lungs  consistent with COPD. Electronically Signed   By: Tollie Ethavid  Kwon M.D.   On: 07/12/2016 19:19   Dg Hip Port Unilat With Pelvis 1v Right  Result Date: 07/13/2016 CLINICAL DATA:  Postop hip fracture EXAM: DG HIP (WITH OR WITHOUT PELVIS) 1V PORT RIGHT COMPARISON:  07/12/2016 FINDINGS: Two views of the right hip submitted. The patient is status post intraoperative repair of right femoral neck fracture. There is right hip prosthesis with anatomic alignment. IMPRESSION: Right hip prosthesis with anatomic alignment. Electronically Signed   By: Natasha MeadLiviu  Pop M.D.   On: 07/13/2016 17:00   Dg Hip Unilat With Pelvis 2-3 Views Right  Result Date: 07/12/2016 CLINICAL DATA:  Patient fell complaining of right lower extremity pain. EXAM: DG HIP (WITH OR WITHOUT PELVIS) 2-3V RIGHT COMPARISON:  10/25/2006 KUB FINDINGS: There is an acute, closed, varus angulated transcervical fracture of the right femur sparing the trochanters. The bony pelvis and contralateral hip are intact. There is aortoiliac and branch vessel atherosclerosis. IMPRESSION: Acute, closed, varus angulated transcervical fracture of the proximal right femur. Electronically Signed   By: Tollie Ethavid  Kwon M.D.   On: 07/12/2016 19:15    Assessment/Plan: 1 Day Post-Op   Active Problems:   Femur fracture, right (HCC)  Patient doing well postop.  Only catheter removed. Patient's hemoglobin and hematocrit are stable. Patient will complete 24 hours postop antibiotics. He'll begin Lovenox for DVT prophylaxis. Patient is weightbearing as tolerated on the right lower extremity less observe posterior hip precautions. Dr. Hyacinth MeekerMiller, my partner, we'll see the patient tomorrow as I'm out of town. Patient will follow up with me in clinic in approximately 10-14 days after discharge.  Patient should be discharged on Lovenox 40 mg daily for total of 4 weeks for DVT prophylaxis.    Juanell FairlyKRASINSKI, Jaskirat Schwieger , MD 07/14/2016, 10:05 AM

## 2016-07-14 NOTE — Progress Notes (Signed)
Notified Dr. Allena KatzPatel that patient has not urinated all day. Bladder scan shows 960 mls of urine bladder. Patient states he does not have any urge to urinate. Order received for In and out Cath once

## 2016-07-14 NOTE — Progress Notes (Signed)
Physical Therapy Treatment Patient Details Name: Miguel Kennedy L Adriano MRN: [id number] DOB: 1931-04-24 Today's Date: 07/14/2016    History of Present Illness Pt. is an 82 y.o. male who [facility] with a RIght Hip Fracture s/p Hemiarthroplasty.     PT Comments    Pt shows good effort with PT session, but is weak, has poor activity tolerance and shows poor general safety awareness.  He is unable to recall hip precautions and though he was pleasant and worked hard he struggled with PT session due to the above factors.  Pt unable to rate pain, and though it was not overly limiting he did reports discomfort and it effected him more than this AM session.   Follow Up Recommendations  SNF     Equipment Recommendations       Recommendations for Other Services       Precautions / Restrictions Precautions Precautions: Posterior Hip;Fall Restrictions Weight Bearing Restrictions: Yes RLE Weight Bearing: Weight bearing as tolerated    Mobility  Bed Mobility Overal bed mobility: Needs Assistance Bed Mobility: Sit to Supine       Sit to supine: Mod assist   General bed mobility comments: Pt unable to get LEs up into bed w/o direct assist to lift and swivel  Transfers Overall transfer level: Needs assistance Equipment used: Rolling walker (2 wheeled) Transfers: Sit to/from Stand Sit to Stand: Min assist         General transfer comment: Pt needing reminders to use UEs appropriately to get to standing, needed extra verbal cues and still required considerable assist to get to standing  Ambulation/Gait Ambulation/Gait assistance: Min assist Ambulation Distance (Feet): 30 Feet Assistive device: Rolling walker (2 wheeled)       General Gait Details: Pt again made good effort but was slow and limited with R LE advancing and strength.  He showed great effort but ultimately was limited with strength and activity tolerance.   Stairs            Wheelchair Mobility     Modified Rankin (Stroke Patients Only)       Balance                                    Cognition Arousal/Alertness: Awake/alert Behavior During Therapy: Impulsive Overall Cognitive Status: Within Functional Limits for tasks assessed (pt continues to be somewhat confused t/o session)                      Exercises Total Joint Exercises Ankle Circles/Pumps: AROM;10 reps Quad Sets: AROM;10 reps Gluteal Sets: Strengthening;10 reps Towel Squeeze: Strengthening;10 reps Short Arc Quad: Strengthening;10 reps Heel Slides: Strengthening;AROM;10 reps Hip ABduction/ADduction: AROM;Strengthening;10 reps Straight Leg Raises: PROM;5 reps    General Comments        Pertinent Vitals/Pain Pain Assessment:  (not rated, reports pain with most acts "Worse than yesterday) Pain Score: 0-No pain    Home Living Family/patient expects to be discharged to:: Private residence Living Arrangements: Spouse/significant other Available Help at Discharge: Family   Home Access: Stairs to enter Entrance Stairs-Rails: Right Home Layout: One level        Prior Function Level of Independence: Independent          PT Goals (current goals can now be found in the care plan section) Acute Rehab PT Goals Patient Stated Goal: To return home Progress towards PT goals: Progressing toward  goals    Frequency    BID      PT Plan Current plan remains appropriate    Co-evaluation             End of Session Equipment Utilized During Treatment: Gait belt Activity Tolerance: Patient limited by fatigue Patient left: with bed alarm set;with call bell/phone within reach     Time: 1351-1420 PT Time Calculation (min) (ACUTE ONLY): 29 min  Charges:  $Gait Training: 8-22 mins $Therapeutic Exercise: 8-22 mins                    G Codes:      Malachi ProGalen R Jules Vidovich, DPT 07/14/2016, 3:19 PM

## 2016-07-14 NOTE — Progress Notes (Signed)
In and Out cath performed with 850cc output. Pt tolerated with no complaints

## 2016-07-14 NOTE — Evaluation (Signed)
Physical Therapy Evaluation Patient Details Name: Gerald Dexterhomas L Chabot MRN: [id number] DOB: Feb 10, 1931 Today's Date: 07/14/2016   History of Present Illness  82 y/o male who fell into [place]mas tree at home with R hip fx and subsequent hemiarthroplasty.  He reports a few weeks of R knee/ankle weakness prior to fall with buckling and some difficulty clearing his toes  Clinical Impression  Pt generally weak and pain limited, but actually was able to walk ~40 ft.  He was very weak in the R LE, unsure if it is all post-surgical; he reports some R sided weakness/buckling for the weeks leading up to this fall.  Pt shows great effort and motivation with PT exam and with ~10 minutes of exercises apart from it.  Overall pt did well and is motivated to go home (and this is possible), but unless he makes considerable increases with bed mobility (and other functional acts) he would struggle to go home safely.    Follow Up Recommendations SNF (Pt still hopeful he could go home with improvement)    Equipment Recommendations       Recommendations for Other Services       Precautions / Restrictions Precautions Precautions: Fall;Posterior Hip Restrictions Weight Bearing Restrictions: Yes RLE Weight Bearing: Weight bearing as tolerated      Mobility  Bed Mobility Overal bed mobility: Needs Assistance Bed Mobility: Supine to Sit     Supine to sit: Mod assist     General bed mobility comments: Pt shows good effort in getting to EOB but was weak and limited.  He was unable to slide LEs off EOB, showed minimal ability to use UE/trunk effectively to get to upright.  Transfers Overall transfer level: Needs assistance Equipment used: Rolling walker (2 wheeled) Transfers: Sit to/from Stand Sit to Stand: Min assist         General transfer comment: Pt was able to use UEs effectively to assist to standing, some initial unsteadiness but overall pt did fairly well getting  upright  Ambulation/Gait Ambulation/Gait assistance: Min assist Ambulation Distance (Feet): 40 Feet Assistive device: Rolling walker (2 wheeled)       General Gait Details: Pt with signficant fatigue with in-room ambulation distance, but did not have any LOBs.  He had slow, short cadence and did struggle to advance R LE with most steps.  Pt reliant on the walker but did display decent relative safety.  Stairs            Wheelchair Mobility    Modified Rankin (Stroke Patients Only)       Balance Overall balance assessment: Modified Independent                                           Pertinent Vitals/Pain Pain Assessment: 0-10 Pain Score: 7  (does not indicate a lot of discomfort with most acts)    Home Living Family/patient expects to be discharged to:: Private residence Living Arrangements: Spouse/significant other;Children Available Help at Discharge: Family   Home Access: Stairs to enter Entrance Stairs-Rails: Right Entrance Stairs-Number of Steps: 4          Prior Function Level of Independence: Independent         Comments: Pt is able to get out of the home for occasional visits to church, eating out, but generally is not out of the house a lot.  Able to walk, etc as  much as needed      Hand Dominance        Extremity/Trunk Assessment   Upper Extremity Assessment: Overall WFL for tasks assessed;Generalized weakness           Lower Extremity Assessment: Generalized weakness (R LE grossly 3- to 3/5, unable to lift against gravity)         Communication   Communication: HOH  Cognition Arousal/Alertness: Awake/alert Behavior During Therapy: WFL for tasks assessed/performed Overall Cognitive Status: Within Functional Limits for tasks assessed                      General Comments      Exercises General Exercises - Lower Extremity Ankle Circles/Pumps: AROM;10 reps Quad Sets: Strengthening;10 reps Gluteal  Sets: Strengthening;10 reps Heel Slides: 10 reps;AROM Hip ABduction/ADduction: AROM;10 reps   Assessment/Plan    PT Assessment Patient needs continued PT services  PT Problem List Decreased strength;Decreased range of motion;Decreased activity tolerance;Decreased balance;Decreased mobility;Decreased coordination;Decreased knowledge of use of DME;Decreased safety awareness;Pain          PT Treatment Interventions DME instruction;Gait training;Functional mobility training;Stair training;Therapeutic activities;Therapeutic exercise;Balance training;Neuromuscular re-education;Patient/family education    PT Goals (Current goals can be found in the Care Plan section)  Acute Rehab PT Goals Patient Stated Goal: go home PT Goal Formulation: With patient Time For Goal Achievement: 07/28/16 Potential to Achieve Goals: Fair    Frequency BID   Barriers to discharge        Co-evaluation               End of Session Equipment Utilized During Treatment: Gait belt Activity Tolerance: Patient limited by fatigue Patient left: with chair alarm set;with call bell/phone within reach           Time: 0837-0909 PT Time Calculation (min) (ACUTE ONLY): 32 min   Charges:   PT Evaluation $PT Eval Low Complexity: 1 Procedure PT Treatments $Therapeutic Exercise: 8-22 mins   PT G Codes:        Malachi ProGalen R Johnedward Brodrick, DPT 07/14/2016, 11:07 AM

## 2016-07-14 NOTE — Evaluation (Signed)
Occupational Therapy Evaluation Patient Details Name: Miguel Kennedy MRN: [id number] DOB: [birth year] Today's Date: 07/14/2016    History of Present Illness Pt. is an 82 y.o. male who was [facility] with a RIght Hip Fracture s/p Hemiarthroplasty.    Clinical Impression   Pt is an 82 y.o. year old Male s/p right THR(posterior approach)  who lives at home with his wife, and daughter. Pt was independent in all ADLs prior to surgery and is eager to return to PLOF.  Pt is currently limited in functional ADLs due to pain, decreased ROM, posterior hip precautions, and weakness. Pt requires maximum assist for LB dressing and bathing skills due to pain and decreased AROM of right LE.  Pt. would benefit from continued skilled OT services for education in assistive devices, functional mobility, and education in recommendations for home modifications to increase safety and prevent falls.  Pt is a good candidate for SNF to continue rehabilitation.      Follow Up Recommendations  No OT follow up    Equipment Recommendations       Recommendations for Other Services       Precautions / Restrictions Precautions Precautions: Posterior Hip;Fall Restrictions Weight Bearing Restrictions: Yes RLE Weight Bearing: Weight bearing as tolerated              ADL Overall ADL's : Needs assistance/impaired Eating/Feeding: Set up   Grooming: Set up               Lower Body Dressing: Maximal assistance [place] Care(Secondry to Posterior Hip Precautions.)                 General ADL Comments: Pt./family education was provided about A/E use for LE ADLs. Pt. family reports they have the A/E at home from his wife's previous surgery. Pt./family education was provided about posterior hip precautions.     Vision     Perception     Praxis      Pertinent Vitals/Pain Pain Assessment: 0-10 Pain Score: 0-No pain     Hand Dominance Right   Extremity/Trunk Assessment Upper Extremity  Assessment Upper Extremity Assessment: Overall WFL for tasks assessed     Communication Communication Communication: HOH   Cognition Arousal/Alertness: Awake/alert Behavior During Therapy: WFL for tasks assessed/performed Overall Cognitive Status: Within Functional Limits for tasks assessed                     General Comments       Exercises Exercises: General Lower Extremity     Shoulder Instructions      Home Living Family/patient expects to be discharged to:: Private residence Living Arrangements: Spouse/significant other Available Help at Discharge: Family   Home Access: Stairs to enter Secretary/administratorntrance Stairs-Number of Steps: 4 Entrance Stairs-Rails: Right Home Layout: One level     Bathroom Shower/Tub: Tub/shower unit (Pt. takes sinkside baths.)     Bathroom Accessibility: Yes              Prior Functioning/Environment Level of Independence: Independent        Comments: Pt is able to get out of the home for occasional visits to church, eating out, but generally is not out of the house a lot.  Able to walk, etc as much as needed         OT Problem List: Decreased strength;Decreased activity tolerance;Pain;Decreased range of motion;Decreased knowledge of use of DME or AE;Decreased knowledge of precautions   OT Treatment/Interventions: Self-care/ADL training;Therapeutic exercise;Therapeutic activities;Energy conservation;DME  and/or AE instruction;Patient/family education    OT Goals(Current goals can be found in the care plan section) Acute Rehab OT Goals Patient Stated Goal: To return home OT Goal Formulation: With patient Potential to Achieve Goals: Good  OT Frequency: Min 1X/week   Barriers to D/C:            Co-evaluation              End of Session    Activity Tolerance: Patient tolerated treatment well Patient left: in chair;with call bell/phone within reach;with chair alarm set;with family/visitor present   Time: 6045-40981155-1212 OT  Time Calculation (min): 17 min Charges:  OT General Charges $OT Visit: 1 Procedure OT Evaluation $OT Eval Moderate Complexity: 1 Procedure G-Codes:    Olegario MessierElaine Gilma Bessette, MS, OTR/L 07/14/2016, 12:29 PM

## 2016-07-14 NOTE — Progress Notes (Signed)
Assumed care of patient at 3pm. Patient without complaint of further chest pain. PICC line placed by outside picc company. Dressing with drainage. Awaiting xray for picc line use.

## 2016-07-14 NOTE — Clinical Social Work Placement (Signed)
   CLINICAL SOCIAL WORK PLACEMENT  NOTE  Date:  07/14/2016  Patient Details  Name: Miguel Kennedy MRN: [id number] Date of Birth: [birth year]  Clinical Social Work is seeking post-discharge placement for this patient at the Skilled  Nursing Facility level of care (*CSW will initial, date and re-position this form in  chart as items are completed):  Yes   Patient/family provided with [place] Clinical Social Work Department's list of facilities offering this level of care within the geographic area requested by the patient (or if unable, by the patient's family).  Yes   Patient/family informed of their freedom to choose among providers that offer the needed level of care, that participate in Medicare, Medicaid or managed care program needed by the patient, have an available bed and are willing to accept the patient.  Yes   Patient/family informed of [place]'s ownership interest in [facility] and [facility], as well as of the fact that they are under no obligation to receive care at these facilities.  PASRR submitted to EDS on 07/13/16     PASRR number received on 07/13/16     Existing PASRR number confirmed on       FL2 transmitted to all facilities in geographic area requested by pt/family on 07/14/16     FL2 transmitted to all facilities within larger geographic area on       Patient informed that his/her managed care company has contracts with or will negotiate with certain facilities, including the following:        Yes   Patient/family informed of bed offers received.  Patient chooses bed at  [facility]([place] )     Physician recommends and patient chooses bed at      Patient to be transferred to   on  .  Patient to be transferred to facility by       Patient family notified on   of transfer.  Name of family member notified:        PHYSICIAN       Additional Comment:    _______________________________________________ Tomislav Micale, Darleen CrockerBailey M,  LCSW 07/14/2016, 3:37 PM

## 2016-07-15 LAB — GLUCOSE, CAPILLARY
GLUCOSE-CAPILLARY: 231 mg/dL — AB (ref 65–99)
Glucose-Capillary: 143 mg/dL — ABNORMAL HIGH (ref 65–99)

## 2016-07-15 LAB — BASIC METABOLIC PANEL
ANION GAP: 5 (ref 5–15)
BUN: 16 mg/dL (ref 6–20)
CALCIUM: 7.8 mg/dL — AB (ref 8.9–10.3)
CO2: 27 mmol/L (ref 22–32)
CREATININE: 0.68 mg/dL (ref 0.61–1.24)
Chloride: 100 mmol/L — ABNORMAL LOW (ref 101–111)
Glucose, Bld: 115 mg/dL — ABNORMAL HIGH (ref 65–99)
Potassium: 4.5 mmol/L (ref 3.5–5.1)
SODIUM: 132 mmol/L — AB (ref 135–145)

## 2016-07-15 LAB — CBC
HEMATOCRIT: 30.6 % — AB (ref 40.0–52.0)
Hemoglobin: 10.3 g/dL — ABNORMAL LOW (ref 13.0–18.0)
MCH: 29.2 pg (ref 26.0–34.0)
MCHC: 33.7 g/dL (ref 32.0–36.0)
MCV: 86.7 fL (ref 80.0–100.0)
PLATELETS: 138 10*3/uL — AB (ref 150–440)
RBC: 3.53 MIL/uL — ABNORMAL LOW (ref 4.40–5.90)
RDW: 15.5 % — AB (ref 11.5–14.5)
WBC: 10.2 10*3/uL (ref 3.8–10.6)

## 2016-07-15 LAB — SURGICAL PATHOLOGY

## 2016-07-15 MED ORDER — METOPROLOL SUCCINATE ER 25 MG PO TB24
25.0000 mg | ORAL_TABLET | Freq: Every day | ORAL | 0 refills | Status: AC
Start: 2016-07-15 — End: ?

## 2016-07-15 MED ORDER — TAMSULOSIN HCL 0.4 MG PO CAPS: 0.4000 mg | ORAL_CAPSULE | Freq: Every day | ORAL | 0 refills | Status: AC

## 2016-07-15 MED ORDER — ENOXAPARIN SODIUM 40 MG/0.4ML ~~LOC~~ SOLN: 40.0000 mg | SUBCUTANEOUS | 0 refills | Status: DC

## 2016-07-15 MED ORDER — TAMSULOSIN HCL 0.4 MG PO CAPS
0.4000 mg | ORAL_CAPSULE | Freq: Every day | ORAL | Status: DC
  Administered 2016-07-15: 0.4 mg via ORAL
  Filled 2016-07-15: qty 1

## 2016-07-15 MED ORDER — ENOXAPARIN SODIUM 40 MG/0.4ML ~~LOC~~ SOLN: 40.0000 mg | SUBCUTANEOUS | 0 refills | Status: AC

## 2016-07-15 MED ORDER — METHOCARBAMOL 500 MG PO TABS: 500.0000 mg | ORAL_TABLET | Freq: Four times a day (QID) | ORAL | 0 refills | Status: AC | PRN

## 2016-07-15 NOTE — Clinical Social Work Placement (Signed)
   CLINICAL SOCIAL WORK PLACEMENT  NOTE  Date:  07/15/2016  Patient Details  Name: Miguel Kennedy MRN: [id number] Date of Birth: October 15, 1930  Clinical Social Work is seeking post-discharge placement for this patient at the Skilled  Nursing Facility level of care (*CSW will initial, date and re-position this form in  chart as items are completed):  Yes   Patient/family provided with [place] Clinical Social Work Department's list of facilities offering this level of care within the geographic area requested by the patient (or if unable, by the patient's family).  Yes   Patient/family informed of their freedom to choose among providers that offer the needed level of care, that participate in Medicare, Medicaid or managed care program needed by the patient, have an available bed and are willing to accept the patient.  Yes   Patient/family informed of [place]'s ownership interest in [facility] and [facility], as well as of the fact that they are under no obligation to receive care at these facilities.  PASRR submitted to EDS on 07/13/16     PASRR number received on 07/13/16     Existing PASRR number confirmed on       FL2 transmitted to all facilities in geographic area requested by pt/family on 07/14/16     FL2 transmitted to all facilities within larger geographic area on       Patient informed that his/her managed care company has contracts with or will negotiate with certain facilities, including the following:        Yes   Patient/family informed of bed offers received.  Patient chooses bed at  [facility]([place] )     Physician recommends and patient chooses bed at      Patient to be transferred to  [facility] ) on 07/15/16.  Patient to be transferred to facility by  [facility] )     Patient family notified on 07/15/16 of transfer.  Name of family member notified:   (Patient's sister in law is at bedside and aware of D/C today. )     PHYSICIAN        Additional Comment:    _______________________________________________ Clarece Drzewiecki, Darleen CrockerBailey M, LCSW 07/15/2016, 12:44 PM

## 2016-07-15 NOTE — Care Management Important Message (Signed)
Important Message  Patient Details  Name: Gerald Dexterhomas L Benning MRN: [id number] Date of Birth: 18-Jul-1931   Medicare Important Message Given:  Yes    Marily MemosLisa M Rashon Westrup, RN 07/15/2016, 11:51 AM

## 2016-07-15 NOTE — Progress Notes (Signed)
In and out cath done. 650cc urine returned. Pt voided 75cc on his own piror to catherization

## 2016-07-15 NOTE — Progress Notes (Signed)
Physical Therapy Treatment Patient Details Name: Miguel Kennedy L [place] MRN: [id number] DOB: May 08, 1931 Today's Date: 07/15/2016    History of Present Illness Pt. is an 82 y.o. male who was [facility] with a RIght Hip Fracture s/p Hemiarthroplasty.     PT Comments    Pt continues to work hard with PT but is still minimally confused about his situation and needs a lot of reminders and cues for both basic and more complex concepts.  Pt was able to tolerate LE exercises, but was inconsistent with c/o pain/discomfort.  He was only able to recall 1 hip precaution w/o assist and despite obvious effort struggled to clean up ambulation, use/position walker appropriately and generally had choppy and inconsistent steppage.  Pt's O2 hardly in the 90s t/o session even at rest on 2.5 liters.   Follow Up Recommendations  SNF     Equipment Recommendations       Recommendations for Other Services       Precautions / Restrictions Precautions Precautions: Posterior Hip;Fall Restrictions RLE Weight Bearing: Weight bearing as tolerated    Mobility  Bed Mobility Overal bed mobility: Needs Assistance Bed Mobility: Supine to Sit     Supine to sit: Mod assist     General bed mobility comments: Assist with getting hips to EOB, getting LEs to EOB and also minimal assist to get trunk to upright  Transfers Overall transfer level: Needs assistance Equipment used: Rolling walker (2 wheeled) Transfers: Sit to/from Stand Sit to Stand: Min assist         General transfer comment: Pt again needing cues to remains safe, within precautions and generally to insure proper execution and staying on task  Ambulation/Gait Ambulation/Gait assistance: Min assist Ambulation Distance (Feet): 50 Feet Assistive device: Rolling walker (2 wheeled)       General Gait Details: Pt with slow, inconsistent cadence but overall showed good determination to increase ambulation.  He did not have the R LE "dragging"  behind as much today but still displayed short, choppy steps and needed constant reminders to stay upright/within the walker.  Pt on 3 liters O2 t/o the session and his vitals remained 89-86% the entire time.  Pt struggled to really clean up cadence despite a lof of cuing and encouragement.    Stairs            Wheelchair Mobility    Modified Rankin (Stroke Patients Only)       Balance                                    Cognition Arousal/Alertness: Awake/alert Behavior During Therapy: Impulsive Overall Cognitive Status: Within Functional Limits for tasks assessed                      Exercises Total Joint Exercises Ankle Circles/Pumps: AROM;10 reps Quad Sets: AROM;10 reps Gluteal Sets: Strengthening;10 reps Short Arc Quad: Strengthening;10 reps Heel Slides: Strengthening;AROM;10 reps Hip ABduction/ADduction: AROM;Strengthening;10 reps    General Comments        Pertinent Vitals/Pain Pain Assessment:  (unable to rate, c/o hip pain during all exercises and walkin)    Home Living                      Prior Function            PT Goals (current goals can now be found in the care  plan section) Progress towards PT goals: Progressing toward goals    Frequency    BID      PT Plan Current plan remains appropriate    Co-evaluation             End of Session Equipment Utilized During Treatment: Gait belt;Oxygen (2.5 in supine, 3 liters while walking) Activity Tolerance: Patient limited by fatigue;Patient limited by pain Patient left: with bed alarm set;with call bell/phone within reach;with family/visitor present     Time: [id number] PT Time Calculation (min) (ACUTE ONLY): 27 min  Charges:  $Gait Training: 8-22 mins $Therapeutic Exercise: 8-22 mins                    G Codes:      Malachi ProGalen R Annalei Friesz, DPT 07/15/2016, 9:47 AM

## 2016-07-15 NOTE — Progress Notes (Signed)
Patient is medically stable for D/C to [facility] today. [facility] to room E[facility]10. RN will call report and arrange EMS for transport. Clinical Child psychotherapistocial Worker (CSW) [facility]. Patient is aware of above. Patient's sister in law is at bedside and aware of D/C today. Please reconsult if future social work needs arise. CSW signing off.   [employer]Bailey Londyn Wotton, LCSW [phone]

## 2016-07-15 NOTE — Progress Notes (Signed)
Notified Dr Sheryle Hailiamond of bladder scan results. Received order to In and out cath

## 2016-07-15 NOTE — Discharge Summary (Addendum)
Miguel Kennedy, is a 82 y.o. male  DOB 1931/06/16  MRN [id number].  Admission date:  07/12/2016  Admitting Physician  Wyatt Hasteavid K Hower, MD  Discharge Date:  07/15/2016   Primary MD  Maryruth HancockSALLIE PATEL, MD  Recommendations for primary care physician for things to follow:   Follow-up with orthopedic doctor Dr. Juanell FairlyKevin Krasinski in 2 weeks.   Admission Diagnosis  Deformity [Q89.9] Fall with injury [W19.XXXA] Closed transcervical fracture of right femur, initial encounter [facility](HCC) [S72.031A]   Discharge Diagnosis  Deformity [Q89.9] Fall with injury [W19.XXXA] Closed transcervical fracture of right femur, initial encounter (HCC) [S72.031A]    Active Problems:   Femur fracture, right [facility](HCC)      Past Medical History:  Diagnosis Date  . ACS (acute coronary syndrome) (HCC)    s/p CABG in 2011  . Diabetes mellitus without complication (HCC)   . Hypertension     Past Surgical History:  Procedure Laterality Date  . APPENDECTOMY    . CORONARY ARTERY BYPASS GRAFT  2011  . HIP ARTHROPLASTY Right 07/13/2016   Procedure: ARTHROPLASTY BIPOLAR HIP (HEMIARTHROPLASTY);  Surgeon: Juanell FairlyKevin Krasinski, MD;  Location: [facility] ORS;  Service: Orthopedics;  Laterality: Right;       History of present illness and  Hospital Course:     Kindly see H&P for history of present illness and admission details, please review complete Labs, Consult reports and Test reports for all details in brief  HPI  from the history and physical done on the day of admission 82 year old male patient with history of essential hypertension, hyperlipidemia, diabetes came in because of the fall, right hip fracture.   Hospital Course  #1. Acute proximal fracture of the right femur:; Patient seen by orthopedic, did have a right hip surgery, right hip arthroplasty done by  November 29th.Patient tolerated the procedure well. Discharging to skilled nursing office today with Lovenox for 4 weeks, follow with orthopedic in 2 weeks. Continue physical therapy  #2. Essential hypertension: BP little borderline today decreased the dose of metoprolol.  #3 diabetes mellitus type 2: Continue Levemir.  History of COPD: Oxygenating better. Hypoxia on RA.when he came due to pain.did not having wheezing.  #4 history of CAD, CABG: Continue beta blockers, statins.  Urine retension;likley post op and pain meds; discharge  with foley,voiding trials,resportedly having  Problems at home,had 900 ml of urine yesterday ,had to be in and out cath,so discharging with foley and flomax,   Discharge Condition: stable   Follow UP  Contact information for after-discharge care    Destination    [facility] .   Specialties:  Skilled Holiday [facility]ursing Facility, Assisted Living Facility Contact information: [address] [facility] [place] [id number] [phone]                Discharge Instructions  and  Discharge Medications        Medication List    TAKE these medications   aspirin 81 MG chewable tablet Chew by mouth daily.   enalapril 2.5 MG tablet Commonly known as:  VASOTEC Take 2.5 mg by mouth daily.   enoxaparin 40 MG/0.4ML injection Commonly known as:  LOVENOX Inject 0.4 mLs (40 mg total) into the skin daily.   gabapentin 100 MG capsule Commonly known as:  NEURONTIN Take 1 capsule by mouth 2 (two) times daily.   HYDROcodone-acetaminophen 5-325 MG tablet Commonly known as:  NORCO/VICODIN Take 1 tablet by mouth every 8 (eight) hours as needed for moderate pain or severe  pain (do not drive or operate machinery while taking as can cause drowsiness).   insulin glargine 100 UNIT/ML injection Commonly known as:  LANTUS Inject 32 Units into the skin at bedtime.   metFORMIN 500 MG tablet Commonly known as:  GLUCOPHAGE Take by mouth 2  (two) times daily with a meal.   methocarbamol 500 MG tablet Commonly known as:  ROBAXIN Take 1 tablet (500 mg total) by mouth every 6 (six) hours as needed for muscle spasms.   metoprolol succinate 25 MG 24 hr tablet Commonly known as:  TOPROL XL Take 1 tablet (25 mg total) by mouth daily. What changed:  medication strength  how much to take  additional instructions   rosuvastatin 40 MG tablet Commonly known as:  CRESTOR Take 40 mg by mouth daily.   tamsulosin 0.4 MG Caps capsule Commonly known as:  FLOMAX Take 1 capsule (0.4 mg total) by mouth daily.         Diet and Activity recommendation: See Discharge Instructions above   Consults obtained - ortho   Major procedures and Radiology Reports - PLEASE review detailed and final reports for all details, in brief -      Dg Pelvis Portable  Result Date: 07/13/2016 CLINICAL DATA:  Status post right hip replacement EXAM: PORTABLE PELVIS 1-2 VIEWS COMPARISON:  07/13/2016 FINDINGS: Cutaneous staples are in place over the lateral aspect of the hip. The patient is status post right hip replacement with anatomic alignment. Pubic symphysis appears intact. Extensive vascular calcifications. Radiopaque artifact over the right hip and proximal thigh IMPRESSION: Status post right hip arthroplasty with anatomic alignment. Radiopaque material over the hip and proximal thigh are suspected to be external to the patient/artifact. Electronically Signed   By: Jasmine PangKim  Fujinaga M.D.   On: 07/13/2016 19:19   Dg Chest Port 1 View  Result Date: 07/12/2016 CLINICAL DATA:  Preop right hip fracture. Hypertension and tobacco use. EXAM: PORTABLE CHEST 1 VIEW COMPARISON:  12/30/2009 FINDINGS: The lungs are hyperinflated without pneumonic consolidation. Bilateral chronic appearing rib fractures are noted involving the left fourth and fifth ribs and right fifth, sixth and seventh ribs. Patient is status post CABG. There is aortic atherosclerosis. No  effusion or pneumothorax. Pulmonary vasculature is unremarkable. IMPRESSION: No acute pneumonic consolidation. Hyperinflated lungs consistent with COPD. Electronically Signed   By: Tollie Ethavid  Kwon M.D.   On: 07/12/2016 19:19   Dg Hip Port Unilat With Pelvis 1v Right  Result Date: 07/13/2016 CLINICAL DATA:  Postop hip fracture EXAM: DG HIP (WITH OR WITHOUT PELVIS) 1V PORT RIGHT COMPARISON:  07/12/2016 FINDINGS: Two views of the right hip submitted. The patient is status post intraoperative repair of right femoral neck fracture. There is right hip prosthesis with anatomic alignment. IMPRESSION: Right hip prosthesis with anatomic alignment. Electronically Signed   By: Natasha MeadLiviu  Pop M.D.   On: 07/13/2016 17:00   Dg Hip Unilat With Pelvis 2-3 Views Right  Result Date: 07/12/2016 CLINICAL DATA:  Patient fell complaining of right lower extremity pain. EXAM: DG HIP (WITH OR WITHOUT PELVIS) 2-3V RIGHT COMPARISON:  10/25/2006 [facility] FINDINGS: There is an acute, closed, varus angulated transcervical fracture of the right femur sparing the trochanters. The bony pelvis and contralateral hip are intact. There is aortoiliac and branch vessel atherosclerosis. IMPRESSION: Acute, closed, varus angulated transcervical fracture of the proximal right femur. Electronically Signed   By: Tollie Ethavid  Kwon M.D.   On: 07/12/2016 19:15    Micro Results     Recent Results (from the past  240 hour(s))  Urine culture     Status: None   Collection Time: 07/12/16 10:22 PM  Result Value Ref Range Status   Specimen Description URINE, RANDOM  Final   Special Requests NONE  Final   Culture NO GROWTH Performed at [facility]   Final   Report Status 07/14/2016 FINAL  Final  Surgical pcr screen     Status: None   Collection Time: 07/12/16 10:22 PM  Result Value Ref Range Status   MRSA, PCR NEGATIVE NEGATIVE Final   Staphylococcus aureus NEGATIVE NEGATIVE Final    Comment:        The Xpert SA Assay (FDA approved for NASAL  specimens in patients over 77 years of age), is one component of a comprehensive surveillance program.  Test performance has been validated by [facility] for patients greater than or equal to 106 year old. It is not intended to diagnose infection nor to guide or monitor treatment.        Today   Subjective:   Miguel Kennedy today has no headache,no chest abdominal pain,no new weakness tingling or numbness, feels much better wants to go home today.   Objective:   Blood pressure (!) 102/57, pulse (!) 52, temperature 98.8 F (37.1 C), temperature source Oral, resp. rate 18, height 6' (1.829 m), weight 63.5 kg (140 lb), SpO2 94 %.   Intake/Output Summary (Last 24 hours) at 07/15/16 1214 Last data filed at 07/15/16 0914  Gross per 24 hour  Intake             1020 ml  Output             2350 ml  Net            -1330 ml    Exam Awake Alert, Oriented x 3, No new F.N deficits, Normal affect [place].AT,PERRAL Supple Neck,No JVD, No cervical lymphadenopathy appriciated.  Symmetrical Chest wall movement, Good air movement bilaterally, CTAB RRR,No Gallops,Rubs or new Murmurs, No Parasternal Heave +ve B.Sounds, Abd Soft, Non tender, No organomegaly appriciated, No rebound -guarding or rigidity. No Cyanosis, Clubbing or edema, No new Rash or bruise  Data Review   CBC w Diff:  Lab Results  Component Value Date   WBC 10.2 07/15/2016   HGB 10.3 (L) 07/15/2016   HCT 30.6 (L) 07/15/2016   PLT 138 (L) 07/15/2016   LYMPHOPCT 20 07/12/2016   MONOPCT 7 07/12/2016   EOSPCT 3 07/12/2016   BASOPCT 1 07/12/2016    CMP:  Lab Results  Component Value Date   NA 132 (L) 07/15/2016   K 4.5 07/15/2016   CL 100 (L) 07/15/2016   CO2 27 07/15/2016   BUN 16 07/15/2016   CREATININE 0.68 07/15/2016   PROT 7.4 07/12/2016   ALBUMIN 3.9 07/12/2016   BILITOT 0.5 07/12/2016   ALKPHOS 72 07/12/2016   AST 32 07/12/2016   ALT 17 07/12/2016  .   Total Time in preparing paper work, data  evaluation and todays exam - 35 minutes  Citlali Gautney M.D on 07/15/2016 at 12:14 PM    Note: This dictation was prepared with Dragon dictation along with smaller phrase technology. Any transcriptional errors that result from this process are unintentional.

## 2016-07-15 NOTE — Progress Notes (Signed)
Bladder scanned pt at 0600. Greater than 650cc's of urine in bladder

## 2016-07-15 NOTE — Progress Notes (Signed)
Subjective: 2 Days Post-Op Procedure(s) (LRB): ARTHROPLASTY BIPOLAR HIP (HEMIARTHROPLASTY) (Right)    Patient reports pain as mild.  Objective:   VITALS:   Vitals:   07/15/16 0351 07/15/16 0738  BP: (!) 105/47 (!) 102/57  Pulse: (!) 106 (!) 52  Resp: 18 18  Temp: 99.1 F (37.3 C) 98.8 F (37.1 C)    Neurologically intact ABD soft Neurovascular intact Sensation intact distally Intact pulses distally Dorsiflexion/Plantar flexion intact Incision: scant drainage  LABS  Recent Labs  07/13/16 0553 07/14/16 0301 07/15/16 0610  HGB 12.9* 11.2* 10.3*  HCT 38.1* 32.8* 30.6*  WBC 9.6 10.9* 10.2  PLT 183 156 138*     Recent Labs  07/13/16 0553 07/14/16 0301 07/15/16 0610  NA 134* 130* 132*  K 3.9 4.0 4.5  BUN 14 17 16   CREATININE 0.72 0.82 0.68  GLUCOSE 66 218* 115*     Recent Labs  07/12/16 1833  INR 1.00     Assessment/Plan: 2 Days Post-Op Procedure(s) (LRB): ARTHROPLASTY BIPOLAR HIP (HEMIARTHROPLASTY) (Right)   Advance diet Up with therapy Discharge to SNF  RTC Dr Martha ClanKrasinski in 2 weeks

## 2016-07-15 NOTE — Progress Notes (Signed)
RN [facility]. Patient alert and oriented. Lungs sounds clear/diminished. Heart sounds normal. Patient metoprolol held this morning per MD order because patient HR 52. Patient has honeycomb dressing on right hip that has scant serosanguinous drainage. Patient not complaining of any pain. Patient is a one assist with front wheel walker. Patient still has not urinated on his own. MD placed order for urethral catheter. RN placed 3316fr urethral catheter with 10cc balloon. Patient stated he had bowel movement yesterday. EMS called for transport. Patient and family notified that follow up appointment not able to be scheduled because of outstanding balance at Dr. Samuel GermanyKrasinski's office.   Harvie HeckMelanie Young Brim, RN

## 2016-07-16 LAB — TYPE AND SCREEN
BLOOD PRODUCT EXPIRATION DATE: 201712102359
BLOOD PRODUCT EXPIRATION DATE: 201712142359
UNIT TYPE AND RH: 6200
UNIT TYPE AND RH: 6200

## 2016-07-16 LAB — GLUCOSE, CAPILLARY: Glucose-Capillary: 111 mg/dL — ABNORMAL HIGH (ref 65–99)

## 2016-07-20 NOTE — Anesthesia Postprocedure Evaluation (Signed)
Anesthesia Post Note  Patient: Miguel Kennedy  Procedure(s) Performed: Procedure(s) (LRB): ARTHROPLASTY BIPOLAR HIP (HEMIARTHROPLASTY) (Right)  Patient location during evaluation: PACU Anesthesia Type: General Level of consciousness: awake Pain management: pain level controlled Vital Signs Assessment: post-procedure vital signs reviewed and stable Respiratory status: spontaneous breathing Cardiovascular status: stable Anesthetic complications: no    Last Vitals:  Vitals:   07/15/16 0738 07/15/16 1408  BP: (!) 102/57 113/60  Pulse: (!) 52 (!) 105  Resp: 18   Temp: 37.1 C 37.3 C    Last Pain:  Vitals:   07/15/16 1408  TempSrc: Oral  PainSc:                  VAN STAVEREN,[name]     

## 2016-07-25 NOTE — Anesthesia Postprocedure Evaluation (Signed)
Anesthesia Post Note  Patient: Miguel Kennedy  Procedure(s) Performed: Procedure(s) (LRB): ARTHROPLASTY BIPOLAR HIP (HEMIARTHROPLASTY) (Right)  Patient location during evaluation: PACU Anesthesia Type: General Level of consciousness: awake Pain management: pain level controlled Vital Signs Assessment: post-procedure vital signs reviewed and stable Respiratory status: spontaneous breathing Cardiovascular status: stable Anesthetic complications: no    Last Vitals:  Vitals:   07/15/16 0738 07/15/16 1408  BP: (!) 102/57 113/60  Pulse: (!) 52 (!) 105  Resp: 18   Temp: 37.1 C 37.3 C    Last Pain:  Vitals:   07/15/16 1408  TempSrc: Oral  PainSc:                  VAN STAVEREN,Kenyia [place]

## 2016-07-25 NOTE — Addendum Note (Signed)
Addendum  created 07/25/16 1402 by Lezlie OctaveGijsbertus F Van Staveren, MD   Sign clinical note

## 2017-03-22 IMAGING — DX DG HIP (WITH OR WITHOUT PELVIS) 1V PORT*R*
1 series · 1 of 1 positions shown · non-contrast
Comparison: 07/12/2016

CLINICAL DATA: Postop hip fracture

EXAM:
DG HIP (WITH OR WITHOUT PELVIS) 1V PORT RIGHT

[pelvis ap]
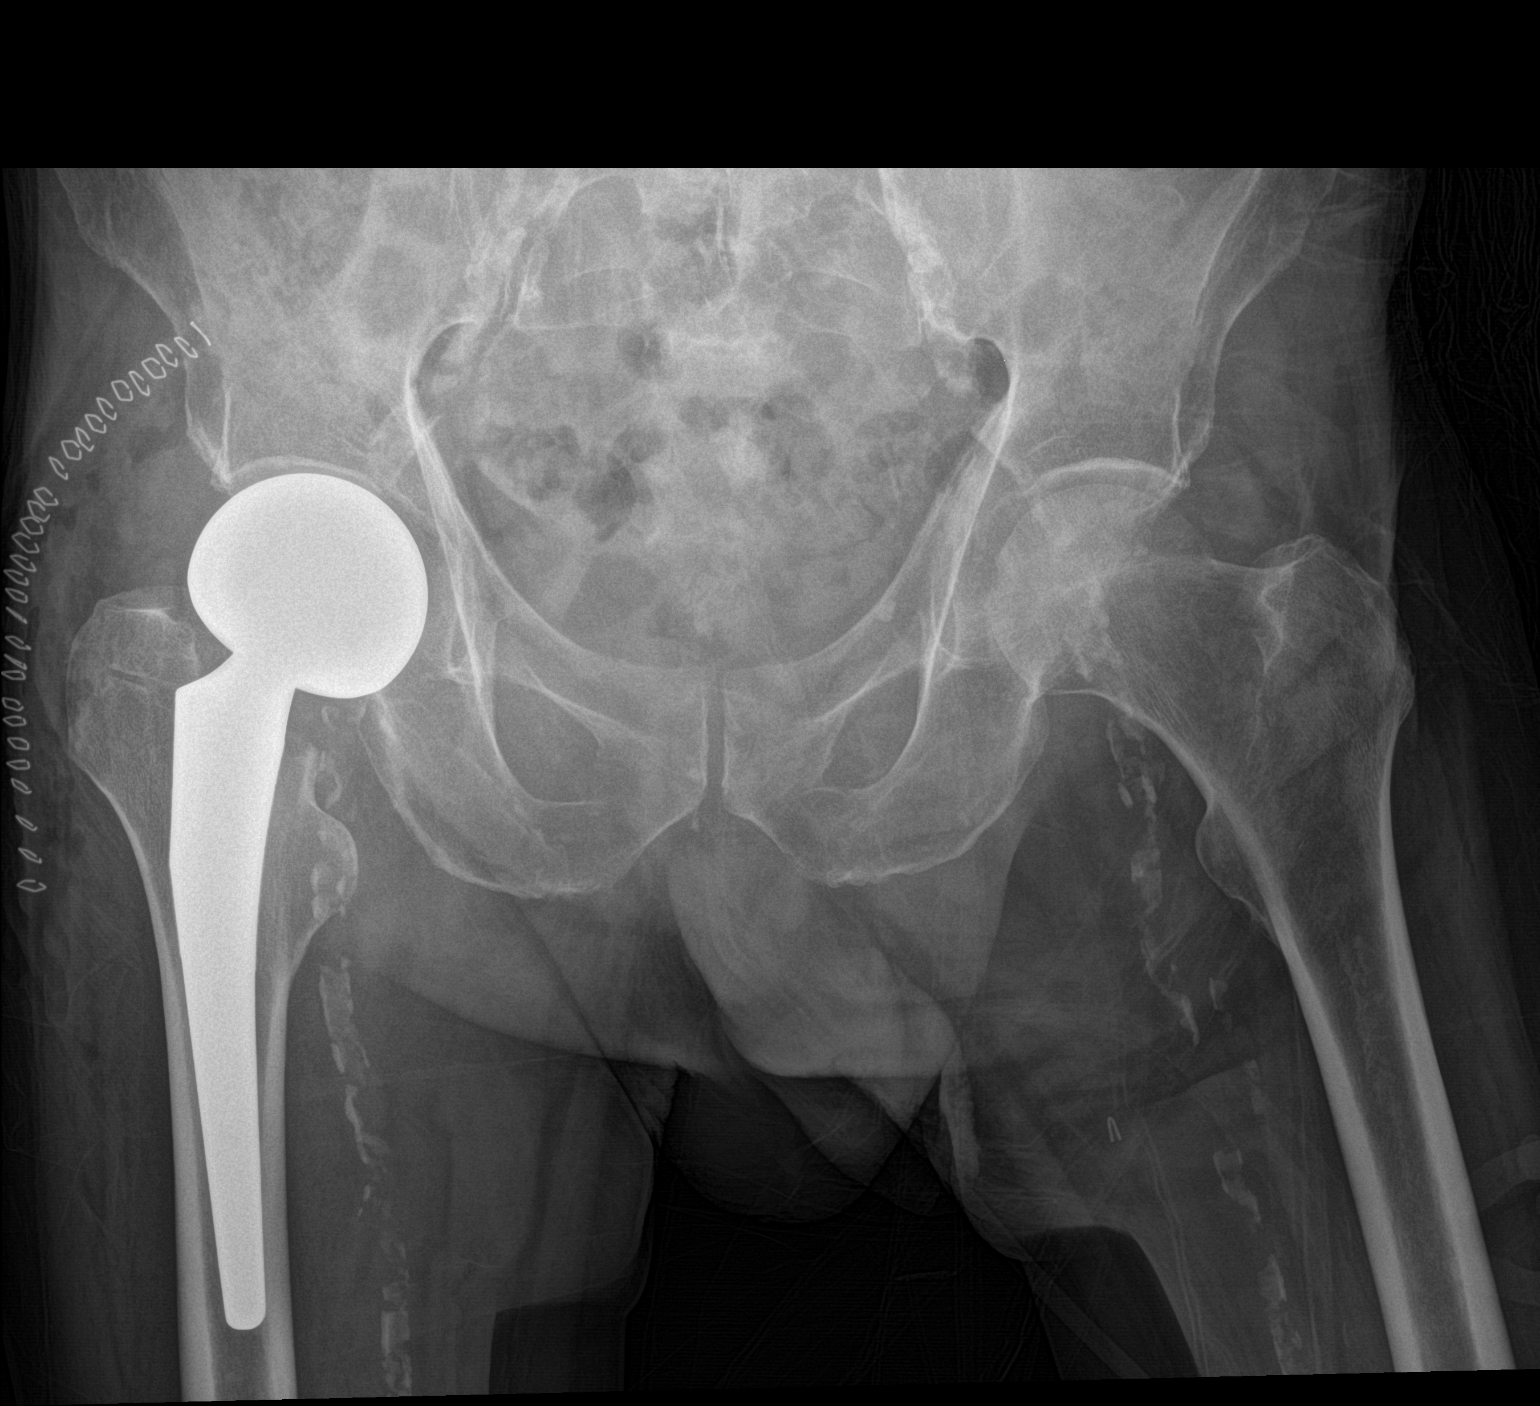

[1 of 1 positions shown; findings below may reference images not displayed]

FINDINGS: Two views of the right hip submitted. The patient is status post
intraoperative repair of right femoral neck fracture. There is right
hip prosthesis with anatomic alignment.
IMPRESSION: Right hip prosthesis with anatomic alignment.

## 2017-03-22 IMAGING — DX DG PORTABLE PELVIS
2 series · 2 of 2 positions shown · non-contrast
Comparison: 07/13/2016

CLINICAL DATA: Status post right hip replacement

EXAM:
PORTABLE PELVIS 1-2 VIEWS

[pelvis ap (1 of 2)]
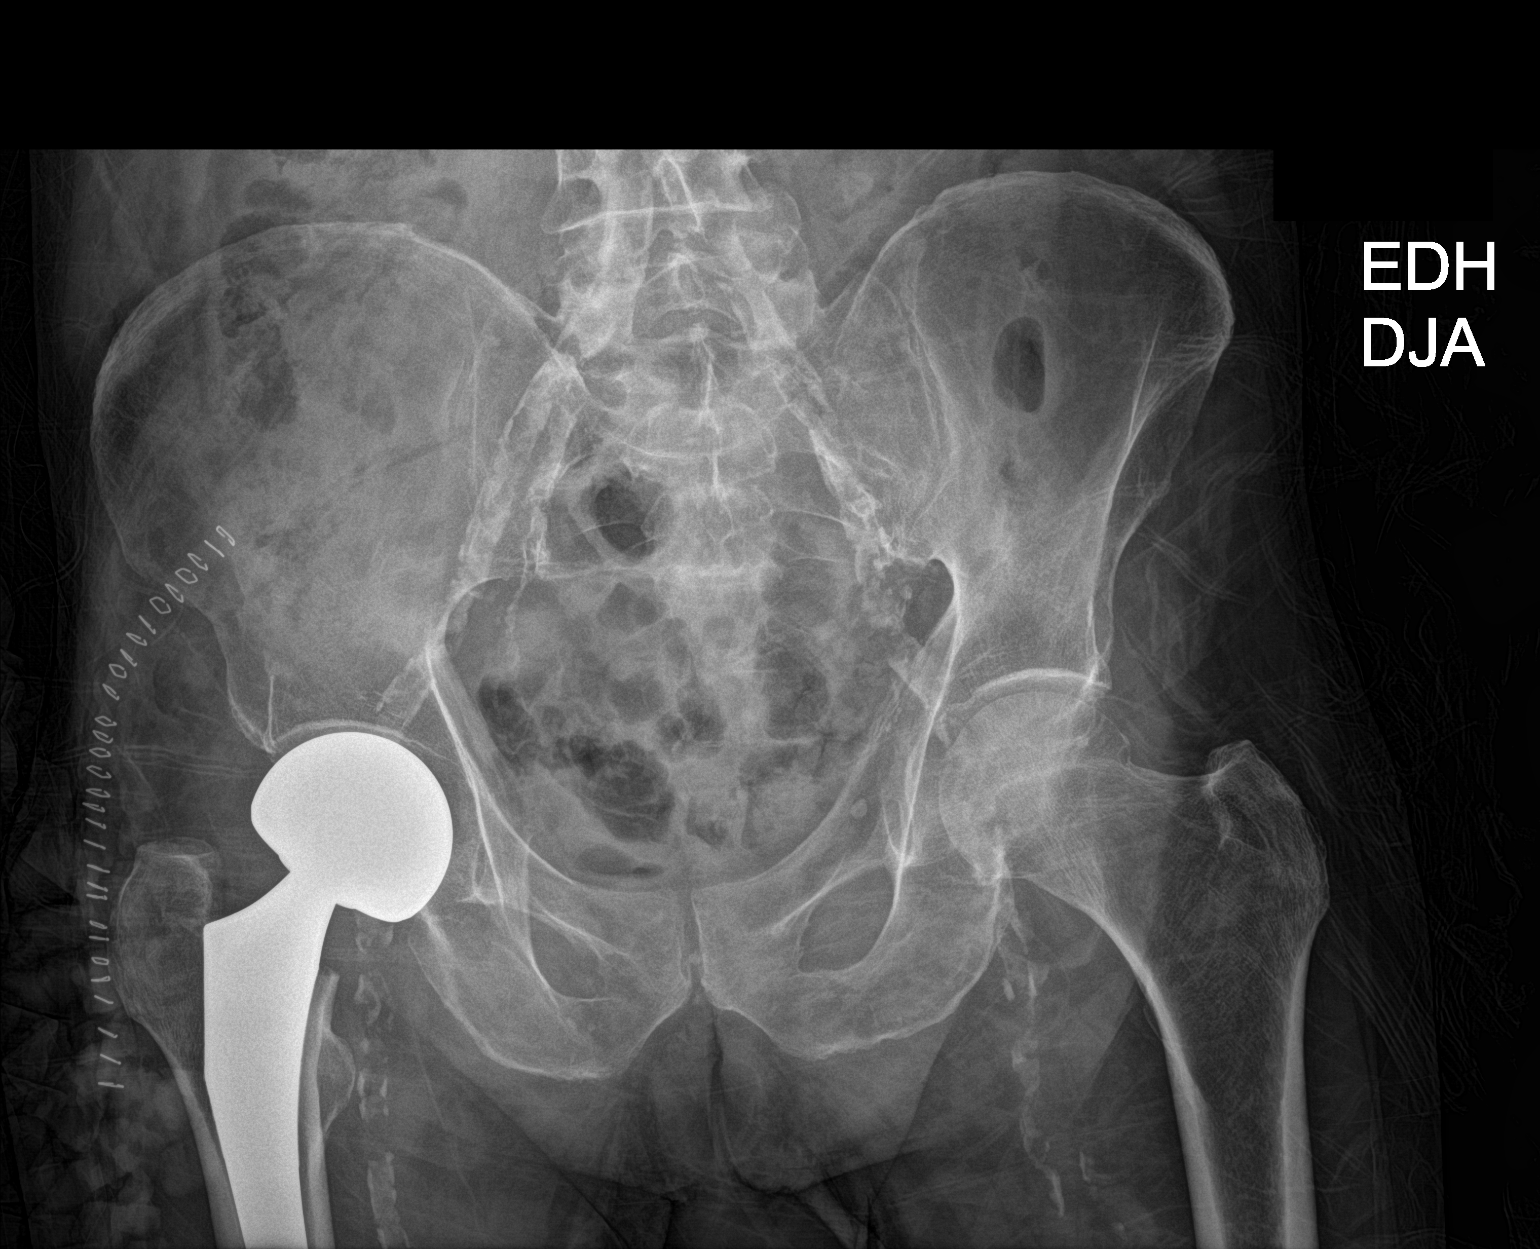

[pelvis ap (2 of 2)]
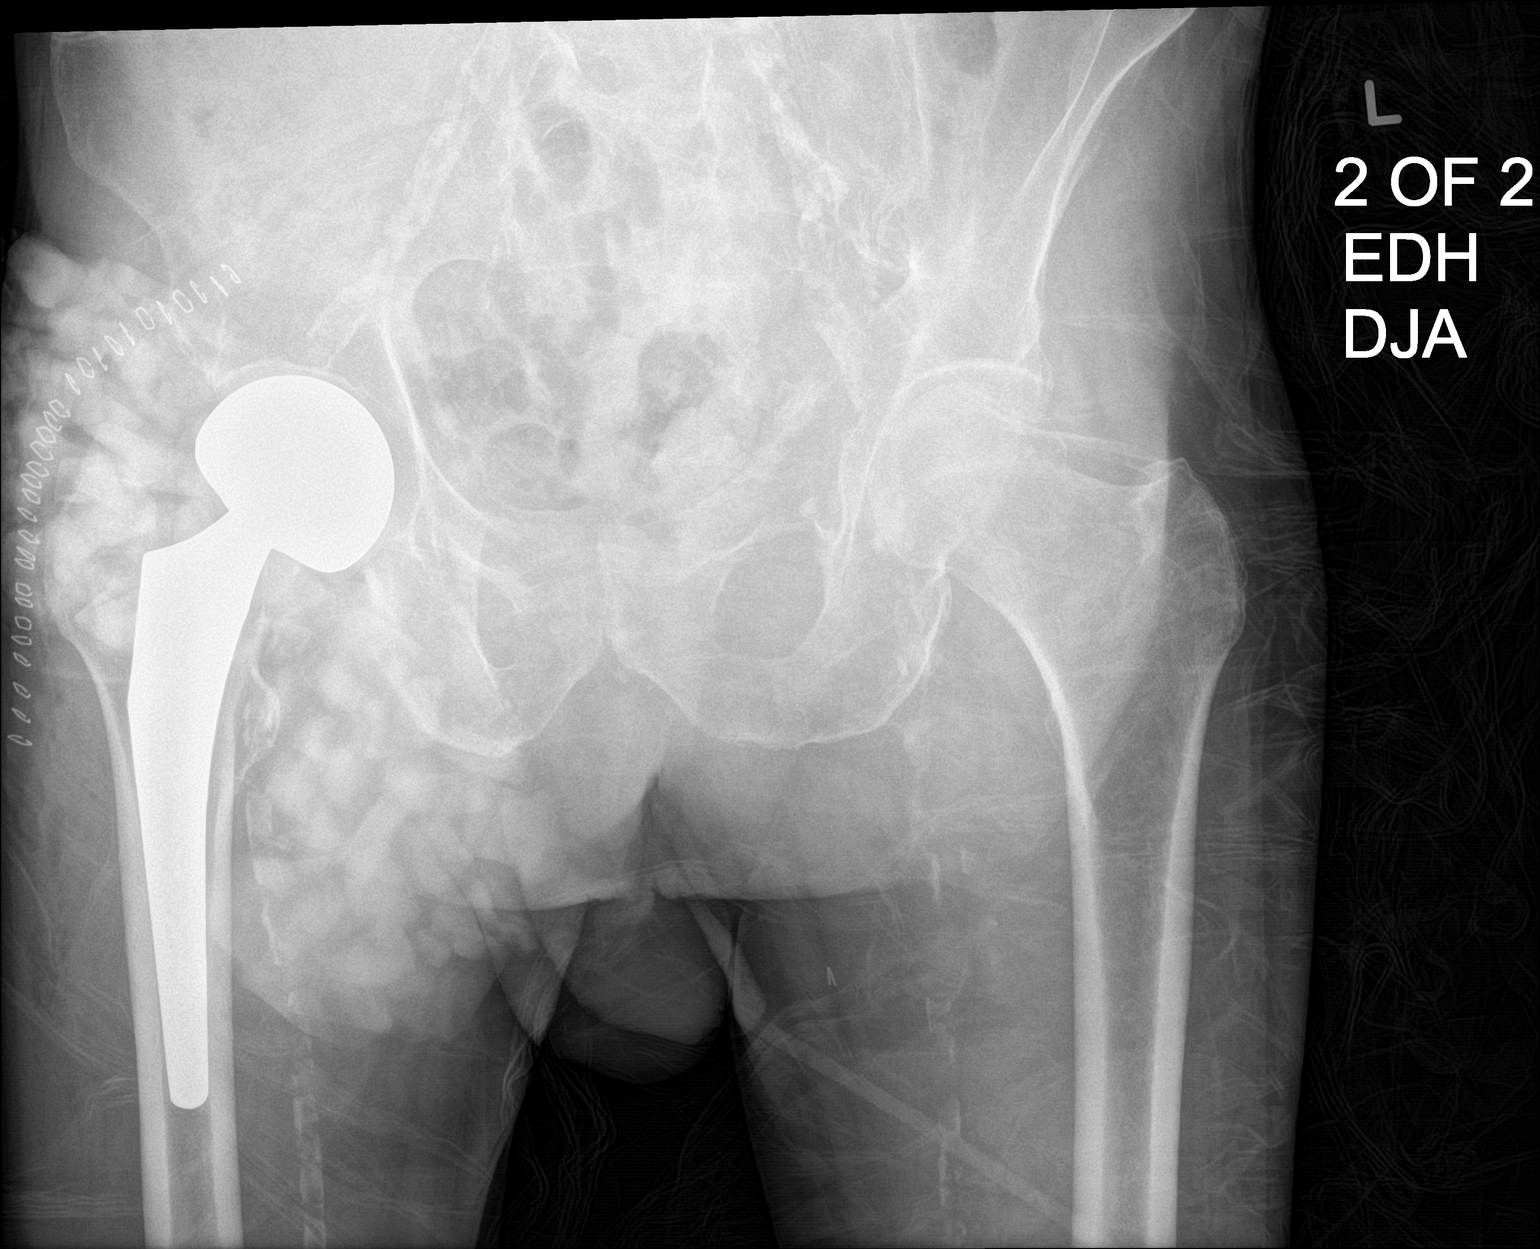

[2 of 2 positions shown; findings below may reference images not displayed]

FINDINGS: Cutaneous staples are in place over the lateral aspect of the hip.
The patient is status post right hip replacement with anatomic
alignment. Pubic symphysis appears intact. Extensive vascular
calcifications. Radiopaque artifact over the right hip and proximal
thigh
IMPRESSION: Status post right hip arthroplasty with anatomic alignment.
Radiopaque material over the hip and proximal thigh are suspected to
be external to the patient/artifact.
